# Patient Record
Sex: Female | Born: 1975 | Race: White | Hispanic: No | Marital: Married | Smoking: Current every day smoker
Health system: Southern US, Community
[De-identification: ages and names within clinical notes are randomized; demographics above are authoritative.]

## PROBLEM LIST (undated history)

## (undated) DIAGNOSIS — Z8541 Personal history of malignant neoplasm of cervix uteri: Secondary | ICD-10-CM

## (undated) DIAGNOSIS — N809 Endometriosis, unspecified: Secondary | ICD-10-CM

## (undated) DIAGNOSIS — G43719 Chronic migraine without aura, intractable, without status migrainosus: Secondary | ICD-10-CM

## (undated) DIAGNOSIS — G5600 Carpal tunnel syndrome, unspecified upper limb: Secondary | ICD-10-CM

## (undated) HISTORY — DX: Personal history of malignant neoplasm of cervix uteri: Z85.41

## (undated) HISTORY — DX: Chronic migraine without aura, intractable, without status migrainosus: G43.719

## (undated) HISTORY — DX: Carpal tunnel syndrome, unspecified upper limb: G56.00

## (undated) HISTORY — DX: Endometriosis, unspecified: N80.9

---

## 2005-02-09 ENCOUNTER — Ambulatory Visit: Payer: Self-pay | Admitting: Family Medicine

## 2005-02-25 ENCOUNTER — Ambulatory Visit: Payer: Self-pay | Admitting: Family Medicine

## 2005-04-02 HISTORY — PX: LEEP: SHX91

## 2005-04-15 ENCOUNTER — Ambulatory Visit: Payer: Self-pay | Admitting: Unknown Physician Specialty

## 2005-05-02 HISTORY — PX: CERVICAL BIOPSY: SHX590

## 2005-05-27 ENCOUNTER — Ambulatory Visit: Payer: Self-pay | Admitting: Unknown Physician Specialty

## 2005-12-13 ENCOUNTER — Ambulatory Visit: Payer: Self-pay | Admitting: Family Medicine

## 2007-08-09 ENCOUNTER — Ambulatory Visit: Payer: Self-pay | Admitting: Family Medicine

## 2007-08-09 DIAGNOSIS — B9789 Other viral agents as the cause of diseases classified elsewhere: Secondary | ICD-10-CM | POA: Insufficient documentation

## 2007-08-10 ENCOUNTER — Telehealth (INDEPENDENT_AMBULATORY_CARE_PROVIDER_SITE_OTHER): Payer: Self-pay | Admitting: Internal Medicine

## 2007-08-10 LAB — CONVERTED CEMR LAB
Basophils Absolute: 0 10*3/uL (ref 0.0–0.1)
Eosinophils Absolute: 0.4 10*3/uL (ref 0.0–0.6)
Eosinophils Relative: 4.3 % (ref 0.0–5.0)
HCT: 45.3 % (ref 36.0–46.0)
Hemoglobin: 15.2 g/dL — ABNORMAL HIGH (ref 12.0–15.0)
Lymphocytes Relative: 36.5 % (ref 12.0–46.0)
MCHC: 33.7 g/dL (ref 30.0–36.0)
MCV: 93.4 fL (ref 78.0–100.0)
Monocytes Absolute: 0.6 10*3/uL (ref 0.2–0.7)
Neutro Abs: 5.3 10*3/uL (ref 1.4–7.7)
Neutrophils Relative %: 52.4 % (ref 43.0–77.0)
WBC: 9.9 10*3/uL (ref 4.5–10.5)

## 2007-10-26 ENCOUNTER — Ambulatory Visit: Payer: Self-pay | Admitting: Family Medicine

## 2007-10-26 DIAGNOSIS — G43719 Chronic migraine without aura, intractable, without status migrainosus: Secondary | ICD-10-CM | POA: Insufficient documentation

## 2007-10-27 ENCOUNTER — Encounter: Payer: Self-pay | Admitting: Internal Medicine

## 2007-10-27 DIAGNOSIS — Z8541 Personal history of malignant neoplasm of cervix uteri: Secondary | ICD-10-CM

## 2007-11-01 ENCOUNTER — Ambulatory Visit: Payer: Self-pay | Admitting: Internal Medicine

## 2007-11-02 ENCOUNTER — Encounter (INDEPENDENT_AMBULATORY_CARE_PROVIDER_SITE_OTHER): Payer: Self-pay | Admitting: *Deleted

## 2008-04-01 ENCOUNTER — Ambulatory Visit: Payer: Self-pay | Admitting: Family Medicine

## 2008-04-01 DIAGNOSIS — G5600 Carpal tunnel syndrome, unspecified upper limb: Secondary | ICD-10-CM

## 2008-08-31 ENCOUNTER — Emergency Department: Payer: Self-pay | Admitting: Emergency Medicine

## 2008-10-02 ENCOUNTER — Ambulatory Visit: Payer: Self-pay | Admitting: Unknown Physician Specialty

## 2008-10-08 ENCOUNTER — Inpatient Hospital Stay: Payer: Self-pay | Admitting: Unknown Physician Specialty

## 2008-10-08 ENCOUNTER — Encounter (INDEPENDENT_AMBULATORY_CARE_PROVIDER_SITE_OTHER): Payer: Self-pay | Admitting: Internal Medicine

## 2008-10-08 HISTORY — PX: WEDGE RESECTION: SHX5070

## 2008-10-18 ENCOUNTER — Encounter (INDEPENDENT_AMBULATORY_CARE_PROVIDER_SITE_OTHER): Payer: Self-pay | Admitting: Internal Medicine

## 2009-05-03 IMAGING — US US PELV - US TRANSVAGINAL
1 series · 16 of 25 positions shown · non-contrast
Comparison: none

REASON FOR EXAM: SUDDEN-ONSELT RLQ PAIN - EVAL OVARIAN CYST, EVAL
APPENDIX IF VISUALIZED
COMMENTS:

[Series 1: us pelv - us transvaginal · 16 of 75 slices shown]
[im 1/75]
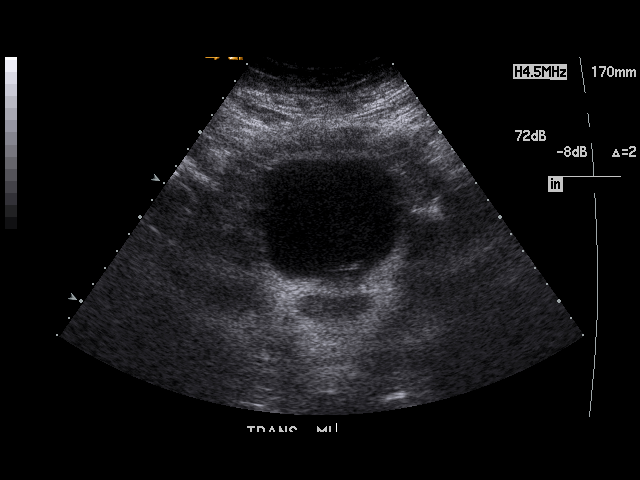
[im 7/75]
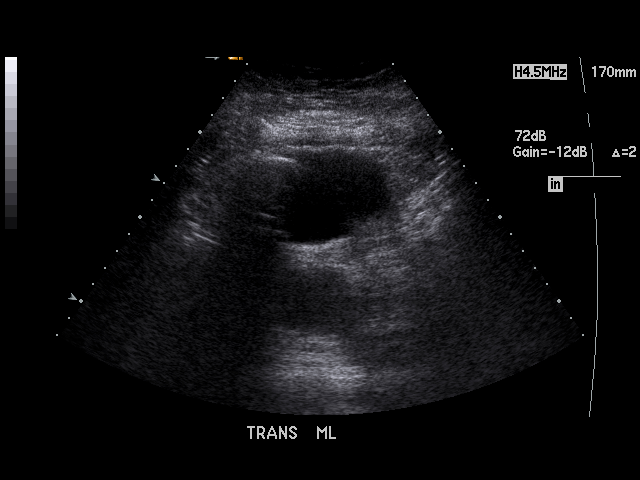
[im 10/75]
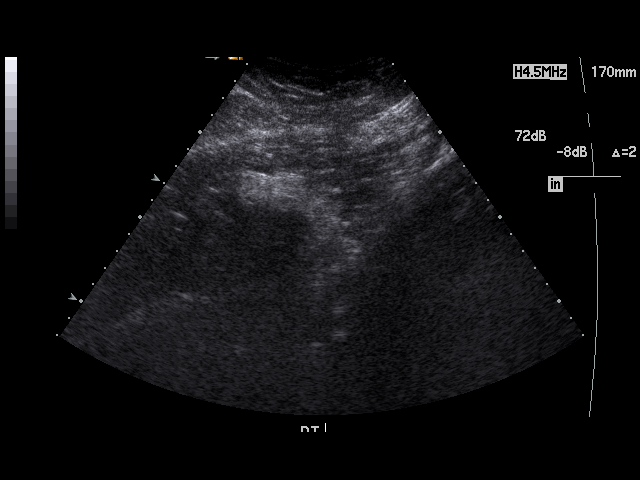
[im 16/75]
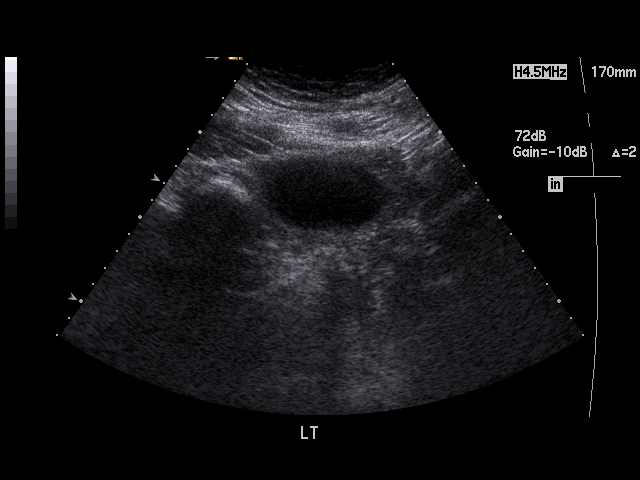
[im 22/75]
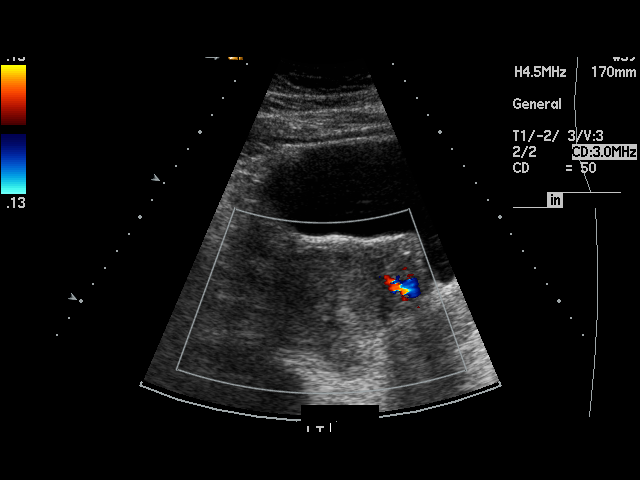
[im 25/75]
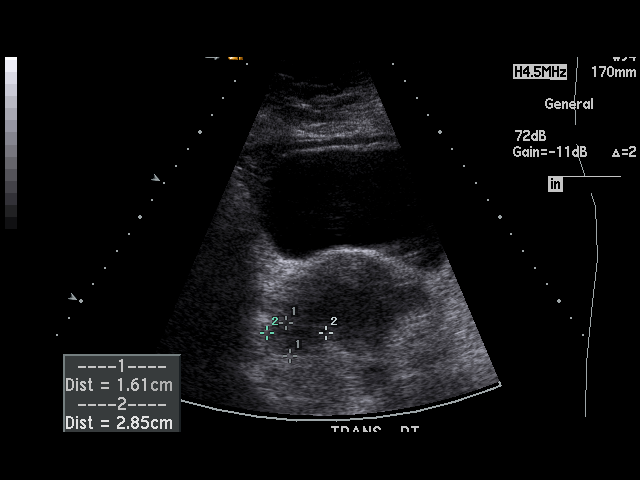
[im 31/75]
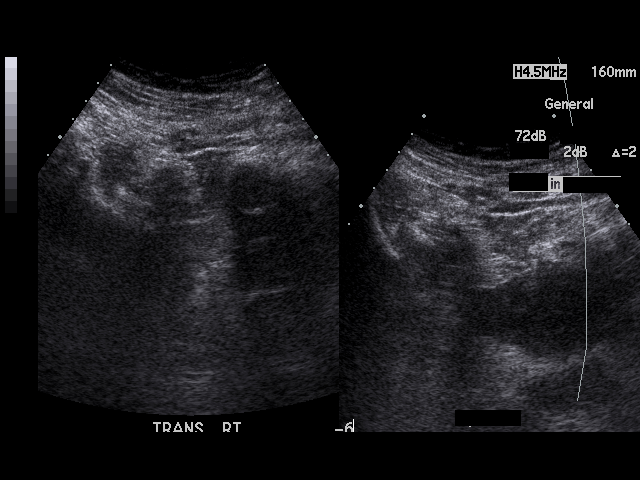
[im 34/75]
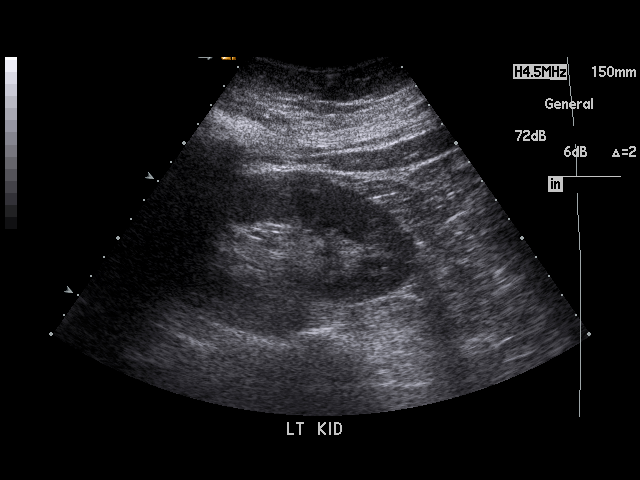
[im 41/75]
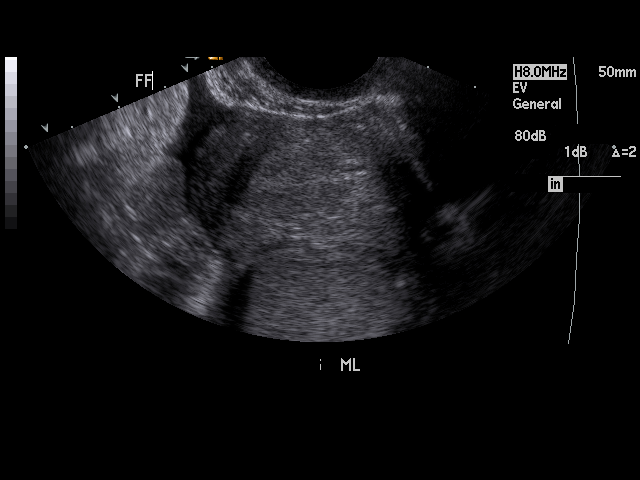
[im 44/75]
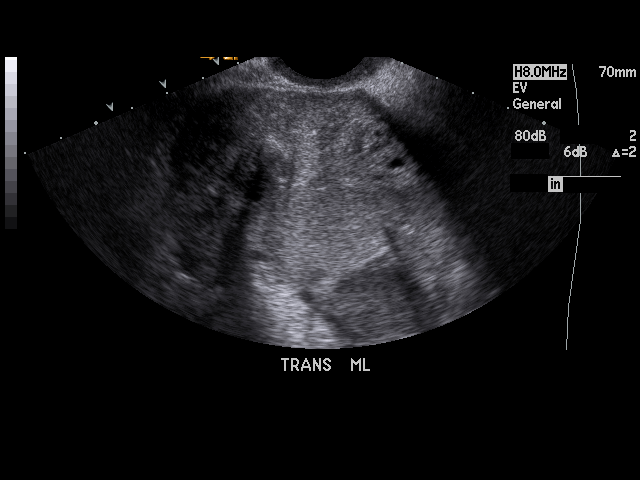
[im 50/75]
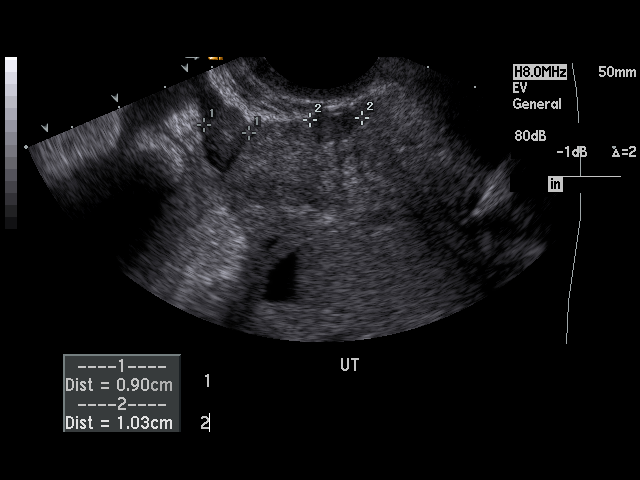
[im 53/75]
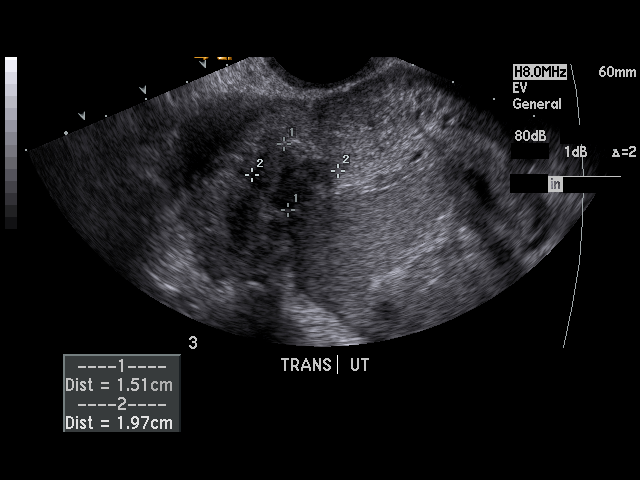
[im 59/75]
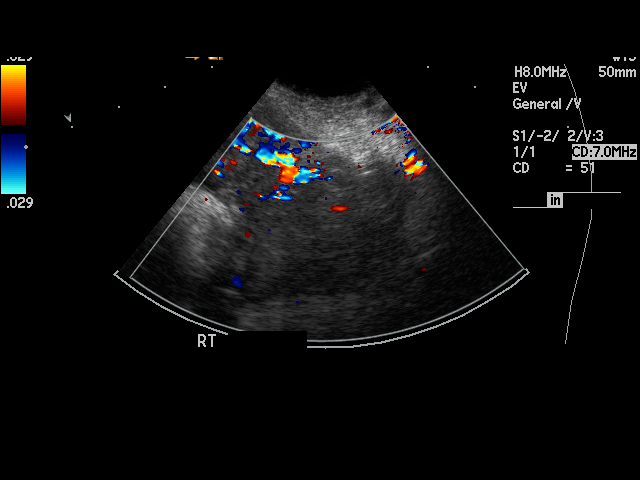
[im 65/75]
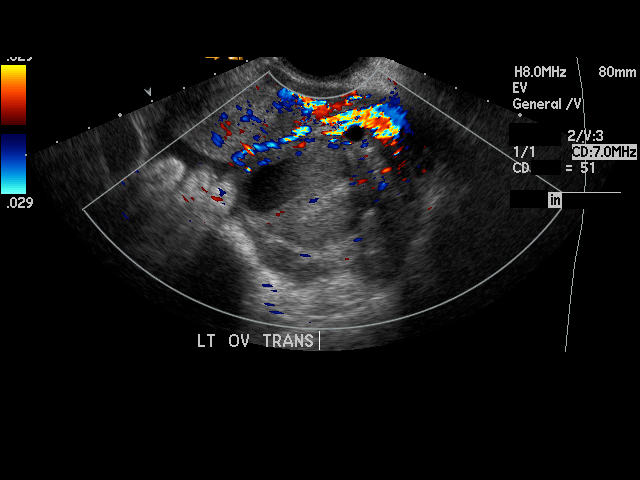
[im 68/75]
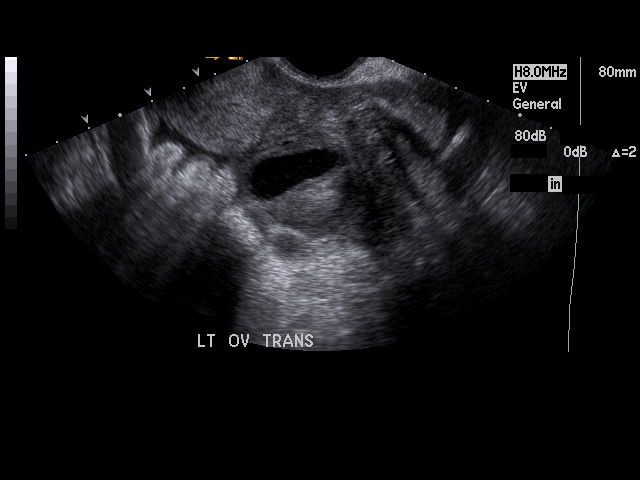
[im 75/75]
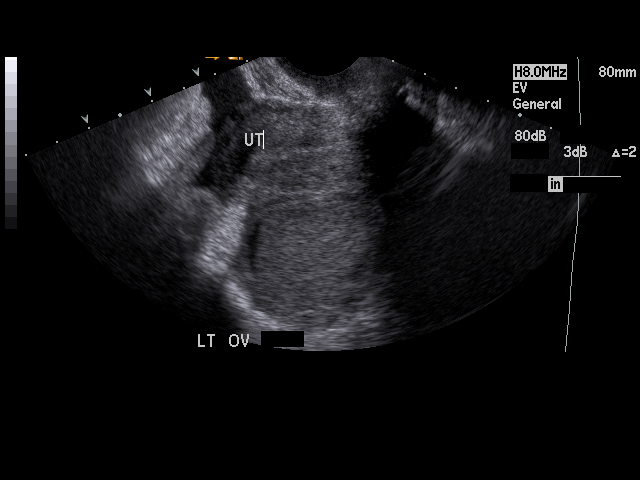

[16 of 25 positions shown; findings below may reference images not displayed]

PROCEDURE:     US  - US PELVIS MASS EXAM W/TRANSVAGI  - August 31, 2008  [DATE]

RESULT:     Transabdominal and endovaginal imaging was obtained as well as
arterial and venous Doppler evaluation of the ovaries.

The uterus measures 6.99 x 3.5 x 5.07 cm. Three, small heterogeneous nodules
are identified within the uterus indicative of multiple leiomyomata. The
endometrium measures 4.1 mm in diameter. Evaluation of the left ovary
demonstrates dimensions of 6.53 x 5.41 x 6.17 cm. A large heterogeneous mass
is appreciated involving the left ovary measuring 6.71 x 4.84 x 5.3 cm. This
area is concerning and differential considerations are a complex and/or
hemorrhagic cyst or possible endometrioma. More ominous etiologies cannot be
excluded and further evaluation with GYN consultation is recommended and, if
clinically warranted, direct visualization. The right ovary measures 3.64 x
2.83 x 3.01 cm and demonstrates a homogeneous echotexture.

Dimensions of the leiomyomata appreciated within the uterus are as follows:
A subserosal nodule is appreciated along the apex of the fundus of the
uterus measuring 9 mm in diameter. A second area is identified anteriorly
measuring 1.03 cm. A third, intramural area is appreciated measuring 1.2 x
1.3 x 1.05 cm.
IMPRESSION: 1.     Large heterogeneous mass involving the left ovary. Differential
considerations are a hemorrhagic versus proteinaceous cyst and endometrioma.
More ominous etiologies cannot be excluded and GYN consultation is
recommended, if and as clinically warranted, as well as direct visualization.
2.     Multiple, small leiomyomata involving the uterus.
3.     Dr. Elayne of the Emergency Department was informed of these findings
via preliminary faxed report on 08/31/2008 at [DATE], Central Standard Time.

## 2009-07-23 ENCOUNTER — Ambulatory Visit: Payer: Self-pay | Admitting: Family Medicine

## 2010-07-30 ENCOUNTER — Ambulatory Visit
Admission: RE | Admit: 2010-07-30 | Discharge: 2010-07-30 | Payer: Self-pay | Source: Home / Self Care | Attending: Family Medicine | Admitting: Family Medicine

## 2010-07-30 DIAGNOSIS — J157 Pneumonia due to Mycoplasma pneumoniae: Secondary | ICD-10-CM

## 2010-09-03 NOTE — Assessment & Plan Note (Signed)
Summary: COLD SYMPTOMS WILL NOT GO AWAY/JRR   Vital Signs:  Patient profile:   35 year old female Height:      64 inches Weight:      240 pounds BMI:     41.34 Temp:     98.0 degrees F oral Pulse rate:   88 / minute Pulse rhythm:   regular BP sitting:   110 / 80  (left arm) Cuff size:   large  Vitals Entered By: Benny Lennert CMA Duncan Dull) (July 30, 2010 11:35 AM)  History of Present Illness: Chief complaint cold symptoms that will not go away  35 year old female:  Congestion in chest face h  R max hurt the other day  when laying down at night, deep cough  Tried mucinex, delsym.   Acute Visit History:      The patient complains of cough, earache, fever, nasal discharge, and sinus problems.  These symptoms began 2 weeks ago.  The patient notes wheezing.  The character of the cough is described as productive.  There is no history of sleep interference, shortness of breath, respiratory retractions, tachypnea, cyanosis, or interference with oral intake associated with her cough.        She complains of sinus pressure.        Urine output has been normal.        Allergies: 1)  ! * Maxalt 2)  ! * Imitrex  Past History:  Past medical, surgical, family and social histories (including risk factors) reviewed, and no changes noted (except as noted below).  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 COLD KNIFE BIOPSY OF CERVIX :(05/2005) LEEP : POSITIVE :(04/2005) wedge resection of L ovarian endometrioma---10/08/2008--[facility]  Family History: Reviewed history and no changes required.  Social History: Reviewed history from 07/23/2009 and no changes required. Marital Status: Married Children: 0 Occupation: Neurosurgeon  Review of Systems   REVIEW OF SYSTEMS GEN: Acute illness details above. CV: No chest pain or SOB GI: No noted N or V Otherwise, pertinent positives and negatives are noted in the HPI.   Physical Exam  Additional Exam:  GEN: A and O x 3. WDWN. NAD.    ENT: Nose clear, ext NML.  No LAD.  No JVD.  TM's clear. Oropharynx clear.  PULM: Normal WOB, no distress. No crackles, diffuse rhonchorous sounds with wheezing rarely CV: RRR, no M/G/R, No rubs, No JVD.   ABD: S, NT, ND, + BS. No rebound. No guarding. No HSM.   EXT: warm and well-perfused, No c/c/e. PSYCH: Pleasant and conversant.    Impression & Recommendations:  Problem # 1:  WALKING PNEUMONIA (ICD-483.0) Assessment New  The following medications were removed from the medication list:    Augmentin 875-125 Mg Tabs (Amoxicillin-pot clavulanate) .Marland Kitchen... Take 1 two times a day Her updated medication list for this problem includes:    Doxycycline Hyclate 100 Mg Caps (Doxycycline hyclate) .Marland Kitchen... Take 1 tab twice a day  Instructed patient to complete antibiotics, and call for worsened shortness of breath or new symptoms.   Complete Medication List: 1)  Flexeril 10 Mg Tabs (Cyclobenzaprine hcl) .... Take 1 tablet by mouth three times a day as needed 2)  Cymbalta 60 Mg Cpep (Duloxetine hcl) .... One tablet daily 3)  Cambia 50 Mg Pack (Diclofenac potassium) .... As needed  for headaches 4)  Doxycycline Hyclate 100 Mg Caps (Doxycycline hyclate) .... Take 1 tab twice a day 5)  Hydrocodone-homatropine 5-1.5 Mg/61ml Syrp (Hydrocodone-homatropine) .Marland Kitchen.. 1 - 2 tsp by mouth at bedtime  Patient Instructions: 1)  PNEUMONIA 2)  -Viral or baterial infections of the lung. Fever, cough, chest pain, shortness of breath, phlegm production, fatigue are symptoms. 3)  Treatment: 4)  1. Take all medicines 5)  2. Antibiotics  6)  3. Cough suppressants 7)  4. Bronchodilators: an inhaler 8)  5. Expectorant like Guaifenesin (Robitussin, Mucinex) 9)  Fluids and Moisture help: drink  lots of fluids 10)  Vaporizier or humidifier in room, shower steam 11)  --help loosen secretions and sooth breathing passages 12)  Elevate head slightly when trying to sleep.  Prescriptions: HYDROCODONE-HOMATROPINE 5-1.5 MG/5ML SYRP (HYDROCODONE-HOMATROPINE) 1 - 2 tsp by mouth at bedtime  #240 mL x 0   Entered and Authorized by:   Hannah Beat MD   Signed by:   Hannah Beat MD on 07/30/2010   Method used:   Print then Give to Patient   RxID:   [id number] DOXYCYCLINE HYCLATE 100 MG CAPS (DOXYCYCLINE HYCLATE) Take 1 tab twice a day  #20 x 0   Entered and Authorized by:   Hannah Beat MD   Signed by:   Hannah Beat MD on 07/30/2010   Method used:   Print then Give to Patient   RxID:   [id number]    Orders Added: 1)  Est. Patient Level IV [30160]    Current Allergies (reviewed today): ! * MAXALT ! * IMITREX

## 2011-03-22 ENCOUNTER — Ambulatory Visit (INDEPENDENT_AMBULATORY_CARE_PROVIDER_SITE_OTHER): Payer: 59 | Admitting: Family Medicine

## 2011-03-22 ENCOUNTER — Encounter: Payer: Self-pay | Admitting: Internal Medicine

## 2011-03-22 ENCOUNTER — Encounter: Payer: Self-pay | Admitting: Family Medicine

## 2011-03-22 VITALS — BP 110/78 | HR 84 | Temp 98.4°F | Ht 66.0 in | Wt 245.0 lb

## 2011-03-22 DIAGNOSIS — S39012A Strain of muscle, fascia and tendon of lower back, initial encounter: Secondary | ICD-10-CM

## 2011-03-22 DIAGNOSIS — S335XXA Sprain of ligaments of lumbar spine, initial encounter: Secondary | ICD-10-CM

## 2011-03-22 MED ORDER — CYCLOBENZAPRINE HCL 10 MG PO TABS: 10.0000 mg | ORAL_TABLET | Freq: Two times a day (BID) | ORAL | Status: AC | PRN

## 2011-03-22 MED ORDER — NAPROXEN 500 MG PO TABS: ORAL_TABLET | ORAL | Status: AC

## 2011-03-22 NOTE — Assessment & Plan Note (Signed)
Flexeril/NSAID scheduled for next week. States can tolerate NSAIDs fine. Supportive care as per instructions with rec to strengthen core muscles to prevent further injury. Update [place] if not improving as expected.

## 2011-03-22 NOTE — Progress Notes (Signed)
  Subjective:    Patient ID: Krystal Campos, female    DOB: 01-05-1976, 35 y.o.   MRN: [id number]  HPI CC: pulled back  Yesterday cleaning and pulled muscle when flipped rubbermaid.  Lower back pain, more on right side.  Has tried ibuprofen 600mg  prn, flexeril which helped some.  Standing/walking worsens pain.  Sitting/laying improves pain.  Unable to bend over.  Also tried ice/heat wrap.  Ice seems to make pain worse.  No fevers/chills, no radiculopathy, no numbness, no bowel/bladder accidents.  Last time this happened was 1 year ago.  Review of Systems Per HPI    Objective:   Physical Exam  Nursing note and vitals reviewed. Constitutional: She appears well-developed and well-nourished. No distress.  HENT:  Head: Normocephalic and atraumatic.  Musculoskeletal:       Midline lumbar tenderness as well as lumbar paraspinous mm tightness. Neg SLR. + pain SIJ right as well as mildly pos FABER on right. No pain with int/ext rotation at hip.  Neurological: She is alert.       Diminished DTRs bilaterally. Sensation intact          Assessment & Plan:

## 2011-03-22 NOTE — Patient Instructions (Addendum)
You have pulled back - lumbar strain. Treat with flexeril and naprosyn regularly for 1 wk then just as needed. Flexeril can make you sleepy. Call us with questions or if not improving as expected. Stretching exercises provided and look into core strengthening exercises.

## 2011-08-11 ENCOUNTER — Encounter: Payer: Self-pay | Admitting: Family Medicine

## 2011-08-11 ENCOUNTER — Ambulatory Visit (INDEPENDENT_AMBULATORY_CARE_PROVIDER_SITE_OTHER): Payer: 59 | Admitting: Family Medicine

## 2011-08-11 VITALS — BP 120/84 | HR 76 | Temp 98.2°F | Resp 20 | Ht 66.0 in | Wt 235.2 lb

## 2011-08-11 DIAGNOSIS — J40 Bronchitis, not specified as acute or chronic: Secondary | ICD-10-CM | POA: Insufficient documentation

## 2011-08-11 MED ORDER — GUAIFENESIN-CODEINE 100-10 MG/5ML PO SYRP: 5.0000 mL | ORAL_SOLUTION | Freq: Three times a day (TID) | ORAL | Status: AC | PRN

## 2011-08-11 MED ORDER — AZITHROMYCIN 250 MG PO TABS: ORAL_TABLET | ORAL | Status: AC

## 2011-08-11 NOTE — Progress Notes (Signed)
Subjective:    Patient ID: Krystal Campos, female    DOB: 1976-06-17, 36 y.o.   MRN: [id number]  HPI Symptoms of uri started on Sunday  Started coughing - husband had been sick  Cough is getting worse and worse - feels tight in chest and honky / barky cough  Not coughing a lot up   Non smoker - used to smoke - quit over a year ago  husb quit too  Nephew who lives with them smokes outside   [place] and stuffy nose and st  Ears are itchy and full   No v/d -- a little nausea   otc meds:  nyquil and dayquil   Patient Active Problem List  Diagnoses  . 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 CARPAL TUNNEL SYNDROME, LEFT  . CARCINOMA IN SITU, CERVIX, HX OF  . Lumbar strain  . Bronchitis   Past Medical History  Diagnosis Date  . Carpal tunnel syndrome   . Personal history of malignant neoplasm of cervix uteri   . Chronic migraine without aura, with intractable migraine, so stated, without mention of status migrainosus    Past Surgical History  Procedure Date  . Cervical biopsy 10/06    cold kinfe bx  . Leep 9/06    positive  . Wedge resection 10/08/08    Left ovarian endometrioma--[facility]   History  Substance Use Topics  . Smoking status: Former Smoker    Quit date: 09/30/2009  . Smokeless tobacco: Not on file  . Alcohol Use: Yes     Rarely   No family history on file. Allergies  Allergen Reactions  . Rizatriptan Benzoate     REACTION: CHEST TIGHTNESS AND NAUSEA  . Sumatriptan     REACTION: CHEST TIGHTNESS AND NAUSEA   Current Outpatient Prescriptions on File Prior to Visit  Medication Sig Dispense Refill  . Diclofenac Potassium (CAMBIA) 50 MG PACK Take by mouth as needed.        . DULoxetine (CYMBALTA) 60 MG capsule Take 60 mg by mouth daily.        . cyclobenzaprine (FLEXERIL) 10 MG tablet Take 1 tablet (10 mg total) by mouth 2 (two) times daily as needed for muscle spasms.  45 tablet  0  . naproxen (NAPROSYN) 500 MG tablet Take one po bid x 1 week  then prn pain, take with food  60 tablet  0      Review of Systems Review of Systems  Constitutional: Negative for and unexpected weight change. pos for low grade fever and malaise Eyes: Negative for pain and visual disturbance.  ENT pos for cong/ st, neg for sinus pain  Respiratory: Negative for sob / wheeze  Cardiovascular: Negative for cp or palpitations    Gastrointestinal: Negative for nausea, diarrhea and constipation.  Genitourinary: Negative for urgency and frequency.  Skin: Negative for pallor or rash   Neurological: Negative for weakness, light-headedness, numbness and headaches.  Hematological: Negative for adenopathy. Does not bruise/bleed easily.  Psychiatric/Behavioral: Negative for dysphoric mood. The patient is not nervous/anxious.         Objective:   Physical Exam  Constitutional: She appears well-developed and well-nourished. No distress.       overwt and well appearing   HENT:  Head: Normocephalic and atraumatic.  Right Ear: External ear normal.  Left Ear: External ear normal.  Mouth/Throat: Oropharynx is clear and moist.       Nares are injected and congested  No sinus tenderness  Eyes: Conjunctivae and EOM are normal. Pupils are equal, round, and reactive to light. Right eye exhibits no discharge. Left eye exhibits no discharge.  Neck: Normal range of motion. Neck supple. No JVD present. Erythema present. No thyromegaly present.  Cardiovascular: Normal rate, regular rhythm and normal heart sounds.   Pulmonary/Chest: Effort normal. No respiratory distress. She has wheezes. She has no rales. She exhibits no tenderness.       Harsh bs diffusely- also distant Fair air exch Wheeze on forced exp only Some rhonchi scattered - mild   Lymphadenopathy:    She has no cervical adenopathy.  Neurological: She is alert.  Skin: Skin is warm and dry. No rash noted. No pallor.  Psychiatric: She has a normal mood and affect.          Assessment & Plan:

## 2011-08-11 NOTE — Assessment & Plan Note (Addendum)
Acute - following uri in former smoker with mild reactive airways (still exp to 2nd hand smoke)  Also hx of mycoplasma in past  She does need a flu shot and pneumovax when better Will cover with zpak  Disc symptomatic care - see instructions on AVS  Robitussin with codeine when home  Update if not starting to improve in a week or if worsening

## 2011-08-11 NOTE — Patient Instructions (Addendum)
Drink lots of fluids - rest as much as you can  Get a flu shot when you are feeling better  Kick the smoker out of the house to smoke  Take zpak as directed  Use cough syrup when not working or driving  Update if not starting to improve in a week or if worsening

## 2012-05-11 ENCOUNTER — Ambulatory Visit: Payer: Self-pay | Admitting: Obstetrics and Gynecology

## 2012-05-30 ENCOUNTER — Ambulatory Visit: Payer: Self-pay | Admitting: Obstetrics and Gynecology

## 2012-05-30 LAB — URINALYSIS, COMPLETE
Bacteria: NONE SEEN
Bilirubin,UR: NEGATIVE
Ph: 6 (ref 4.5–8.0)
Protein: NEGATIVE
RBC,UR: 2 /HPF (ref 0–5)
Squamous Epithelial: 1

## 2012-05-30 LAB — CBC
HCT: 40.4 % (ref 35.0–47.0)
HGB: 13.5 g/dL (ref 12.0–16.0)
MCV: 88 fL (ref 80–100)
RBC: 4.57 10*6/uL (ref 3.80–5.20)
RDW: 14.5 % (ref 11.5–14.5)
WBC: 11.1 10*3/uL — ABNORMAL HIGH (ref 3.6–11.0)

## 2012-05-30 LAB — PREGNANCY, URINE: Pregnancy Test, Urine: NEGATIVE m[IU]/mL

## 2012-06-02 HISTORY — PX: TOTAL ABDOMINAL HYSTERECTOMY W/ BILATERAL SALPINGOOPHORECTOMY: SHX83

## 2012-06-06 ENCOUNTER — Inpatient Hospital Stay: Payer: Self-pay | Admitting: Obstetrics and Gynecology

## 2012-06-06 LAB — BASIC METABOLIC PANEL
Anion Gap: 6 — ABNORMAL LOW (ref 7–16)
BUN: 11 mg/dL (ref 7–18)
Creatinine: 1.33 mg/dL — ABNORMAL HIGH (ref 0.60–1.30)
EGFR (African American): 59 — ABNORMAL LOW
Glucose: 157 mg/dL — ABNORMAL HIGH (ref 65–99)
Osmolality: 275 (ref 275–301)

## 2012-06-06 LAB — HEMATOCRIT: HCT: 28.5 % — ABNORMAL LOW (ref 35.0–47.0)

## 2012-06-07 ENCOUNTER — Encounter: Payer: Self-pay | Admitting: Family Medicine

## 2012-06-07 LAB — CBC WITH DIFFERENTIAL/PLATELET
Basophil #: 0 10*3/uL (ref 0.0–0.1)
Basophil %: 0.1 %
Eosinophil #: 0 10*3/uL (ref 0.0–0.7)
HCT: 32 % — ABNORMAL LOW (ref 35.0–47.0)
Lymphocyte #: 2.2 10*3/uL (ref 1.0–3.6)
Lymphocyte %: 11.1 %
MCH: 28.5 pg (ref 26.0–34.0)
MCV: 87 fL (ref 80–100)
Neutrophil #: 15.2 10*3/uL — ABNORMAL HIGH (ref 1.4–6.5)
Platelet: 255 10*3/uL (ref 150–440)
RBC: 3.7 10*6/uL — ABNORMAL LOW (ref 3.80–5.20)
RDW: 15.1 % — ABNORMAL HIGH (ref 11.5–14.5)

## 2012-06-07 LAB — BASIC METABOLIC PANEL
Anion Gap: 8 (ref 7–16)
Calcium, Total: 7.1 mg/dL — ABNORMAL LOW (ref 8.5–10.1)
Co2: 22 mmol/L (ref 21–32)
Creatinine: 1.11 mg/dL (ref 0.60–1.30)
Potassium: 4.9 mmol/L (ref 3.5–5.1)
Sodium: 140 mmol/L (ref 136–145)

## 2012-06-08 LAB — BASIC METABOLIC PANEL
Anion Gap: 6 — ABNORMAL LOW (ref 7–16)
BUN: 9 mg/dL (ref 7–18)
Calcium, Total: 7.5 mg/dL — ABNORMAL LOW (ref 8.5–10.1)
Chloride: 115 mmol/L — ABNORMAL HIGH (ref 98–107)
Co2: 24 mmol/L (ref 21–32)
EGFR (African American): >60
Osmolality: 289 (ref 275–301)

## 2012-06-08 LAB — HEMOGLOBIN: HGB: 7.9 g/dL — ABNORMAL LOW (ref 12.0–16.0)

## 2012-06-09 LAB — CBC WITH DIFFERENTIAL/PLATELET
Basophil %: 0.1 %
Eosinophil #: 0.3 10*3/uL (ref 0.0–0.7)
HCT: 27.7 % — ABNORMAL LOW (ref 35.0–47.0)
HGB: 9.2 g/dL — ABNORMAL LOW (ref 12.0–16.0)
Lymphocyte %: 11.2 %
MCHC: 33.2 g/dL (ref 32.0–36.0)
Monocyte #: 1.6 x10 3/mm — ABNORMAL HIGH (ref 0.2–0.9)
Monocyte %: 10.3 %
Neutrophil %: 76.4 %
RBC: 3.2 10*6/uL — ABNORMAL LOW (ref 3.80–5.20)
RDW: 15.3 % — ABNORMAL HIGH (ref 11.5–14.5)
WBC: 15.9 10*3/uL — ABNORMAL HIGH (ref 3.6–11.0)

## 2012-09-21 ENCOUNTER — Encounter: Payer: Self-pay | Admitting: Family Medicine

## 2012-09-21 ENCOUNTER — Ambulatory Visit (INDEPENDENT_AMBULATORY_CARE_PROVIDER_SITE_OTHER): Payer: 59 | Admitting: Family Medicine

## 2012-09-21 VITALS — BP 122/78 | HR 84 | Temp 98.3°F | Wt 227.8 lb

## 2012-09-21 DIAGNOSIS — L720 Epidermal cyst: Secondary | ICD-10-CM

## 2012-09-21 DIAGNOSIS — L723 Sebaceous cyst: Secondary | ICD-10-CM

## 2012-09-21 MED ORDER — CEPHALEXIN 500 MG PO CAPS: 500.0000 mg | ORAL_CAPSULE | Freq: Three times a day (TID) | ORAL | Status: DC

## 2012-09-21 NOTE — Assessment & Plan Note (Addendum)
Anticipate epidermal cyst of neck - treat with antibiotic course given evidence of current infection and refer to derm given location for further eval/tx. Pt agrees with plan.

## 2012-09-21 NOTE — Patient Instructions (Signed)
I think you have epidermal cyst - treat with antibiotic course and referral to dermatology for further evaluation given location May use anti inflammatories for pain as needed.  Epidermal Cyst An epidermal cyst is sometimes called a sebaceous cyst, epidermal inclusion cyst, or infundibular cyst. These cysts usually contain a substance that looks "pasty" or "cheesy" and may have a bad smell. This substance is a protein called keratin. Epidermal cysts are usually found on the face, neck, or trunk. They may also occur in the vaginal area or other parts of the genitalia of both men and women. Epidermal cysts are usually small, painless, slow-growing bumps or lumps that move freely under the skin. It is important not to try to pop them. This may cause an infection and lead to tenderness and swelling. CAUSES  Epidermal cysts may be caused by a deep penetrating injury to the skin or a plugged hair follicle, often associated with acne. SYMPTOMS  Epidermal cysts can become inflamed and cause:  Redness.  Tenderness.  Increased temperature of the skin over the bumps or lumps.  Grayish-white, bad smelling material that drains from the bump or lump. DIAGNOSIS  Epidermal cysts are easily diagnosed by your caregiver during an exam. Rarely, a tissue sample (biopsy) may be taken to rule out other conditions that may resemble epidermal cysts. TREATMENT   Epidermal cysts often get better and disappear on their own. They are rarely ever cancerous.  If a cyst becomes infected, it may become inflamed and tender. This may require opening and draining the cyst. Treatment with antibiotics may be necessary. When the infection is gone, the cyst may be removed with minor surgery.  Small, inflamed cysts can often be treated with antibiotics or by injecting steroid medicines.  Sometimes, epidermal cysts become large and bothersome. If this happens, surgical removal in your caregiver's office may be necessary. HOME  CARE INSTRUCTIONS  Only take over-the-counter or prescription medicines as directed by your caregiver.  Take your antibiotics as directed. Finish them even if you start to feel better. SEEK MEDICAL CARE IF:   Your cyst becomes tender, red, or swollen.  Your condition is not improving or is getting worse.  You have any other questions or concerns. MAKE SURE YOU:  Understand these instructions.  Will watch your condition.  Will get help right away if you are not doing well or get worse. Document Released: 06/19/2004 Document Revised: 10/11/2011 Document Reviewed: 01/25/2011 [facility] Patient Information 2013 [place], [place].

## 2012-09-21 NOTE — Progress Notes (Signed)
  Subjective:    Patient ID: Krystal Campos, female    DOB: [birth year], 37 y.o.   MRN: [id number]  HPI CC: lump on neck  Intermittent h/o bump on left neck comes and goes.  Enlarges then decreases in size.  Now more tender, swollen L mandibular LN.  More swollen, red and tender.  No fevers/chills, no other skin lesions, oral lesions.  So far has tried topical blemish creams which hasn't helped.  Also using warm compresses.  On wound vac for poorly healing abdominal hysterectomy wound.  No h/o cysts on skin.    Past Medical History  Diagnosis Date  . Carpal tunnel syndrome   . Personal history of malignant neoplasm of cervix uteri   . Chronic migraine without aura, with intractable migraine, so stated, without mention of status migrainosus     Past Surgical History  Procedure Laterality Date  . Cervical biopsy  10/06    cold kinfe bx  . Leep  9/06    positive  . Wedge resection  10/08/08    Left ovarian endometrioma--[facility]  . Total abdominal hysterectomy w/ bilateral salpingoophorectomy  2013    done for dysmenorrhagia and endometriosis - ?supracervical     Review of Systems Per HPI    Objective:   Physical Exam  Nursing note and vitals reviewed. Constitutional: She appears well-developed and well-nourished. No distress.  Lymphadenopathy:    She has cervical adenopathy (L mandibular LAD).  Skin: Skin is warm and dry.  Nodule with central hole left inferior neck, tender to palpation, erythematous and swollen       Assessment & Plan:

## 2012-10-18 ENCOUNTER — Ambulatory Visit: Payer: Self-pay | Admitting: Obstetrics and Gynecology

## 2012-10-24 ENCOUNTER — Encounter: Payer: Self-pay | Admitting: Family Medicine

## 2012-10-24 ENCOUNTER — Ambulatory Visit (INDEPENDENT_AMBULATORY_CARE_PROVIDER_SITE_OTHER): Payer: 59 | Admitting: Family Medicine

## 2012-10-24 VITALS — BP 110/78 | HR 84 | Temp 98.3°F | Ht 66.0 in | Wt 230.0 lb

## 2012-10-24 DIAGNOSIS — M25562 Pain in left knee: Secondary | ICD-10-CM | POA: Insufficient documentation

## 2012-10-24 DIAGNOSIS — M25569 Pain in unspecified knee: Secondary | ICD-10-CM

## 2012-10-24 MED ORDER — NAPROXEN 500 MG PO TABS: ORAL_TABLET | ORAL | Status: DC

## 2012-10-24 NOTE — Progress Notes (Signed)
  Subjective:    Patient ID: Krystal Campos, female    DOB: Aug 27, 1975, 37 y.o.   MRN: [id number]  HPI CC: L knee pain  L knee bothering her for last few weeks, now noted increased swelling.  Trouble walking 2/2 pain.  Wakes her up at night.  Main pain is with flexion anterior knee at knee cap.  Some locking when squatting.  No giving out.  Some instability felt.  Popping.  Denies redness or warmth.  No h/o knee problems in past.  Denies inciting trauma/injury.  Week prior to pain did walk 1 mi along trail, but no injury.  So far taking ibuprofen 400mg  which helps.  Has not iced knee.  Past Medical History  Diagnosis Date  . Carpal tunnel syndrome   . Personal history of malignant neoplasm of cervix uteri   . Chronic migraine without aura, with intractable migraine, so stated, without mention of status migrainosus      Review of Systems Per HPI    Objective:   Physical Exam  Nursing note and vitals reviewed. Constitutional: She appears well-developed and well-nourished. No distress.  Musculoskeletal: She exhibits no edema.  R knee WNL  L knee - no erythema/calor or deformity. No significant tenderness to palpation at knee crepitus with flexion/extension.  FROM. Neg drawer test. Minimal tenderness with McMurray's  ++ PF grind. No abnormal patellar mobility. No ligament laxity with testing valgus/varus       Assessment & Plan:

## 2012-10-24 NOTE — Patient Instructions (Signed)
I think you have patello femoral pain. Treat with prescription anti inflammatory twice daily with food then as needed. Do stretching/strengthening exercises discussed. Focus on leg raises with foot pointed outward to strengthen quads. Let us know if any worsening or not improving as expected.

## 2012-10-24 NOTE — Assessment & Plan Note (Signed)
Anticipate patello femoral pain syndrome. Discussed this as well as treatment to include prescription NSAIDs, ice, elevation ,rest.   Provided with handout and stretching from [facility] pt advisor on runner's knee. To update if sxs persist or worsen.

## 2012-10-26 ENCOUNTER — Encounter: Payer: Self-pay | Admitting: Family Medicine

## 2013-01-11 IMAGING — US ULTRASOUND RIGHT BREAST
1 series · 14 of 20 positions shown · non-contrast
Comparison: none

REASON FOR EXAM: AV RT NODULAR DENSITY
COMMENTS:

PROCEDURE:     US  - US BREAST RIGHT  - May 11, 2012 [DATE]
RESULT:     Right breast ultrasound reveals no cystic or solid abnormality.
Given the previously identified  mammographic finding ,it is suggested that
a repeat mammogram in 3-6 months be obtained for further evaluation.

[Series 1: ultrasound right breast · 0.11mm/px · 14 of 20 slices shown]
[im 1/20]
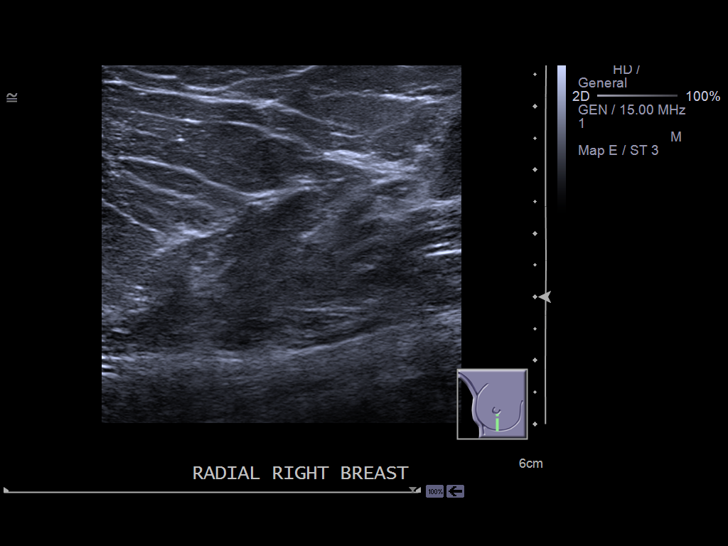
[im 3/20]
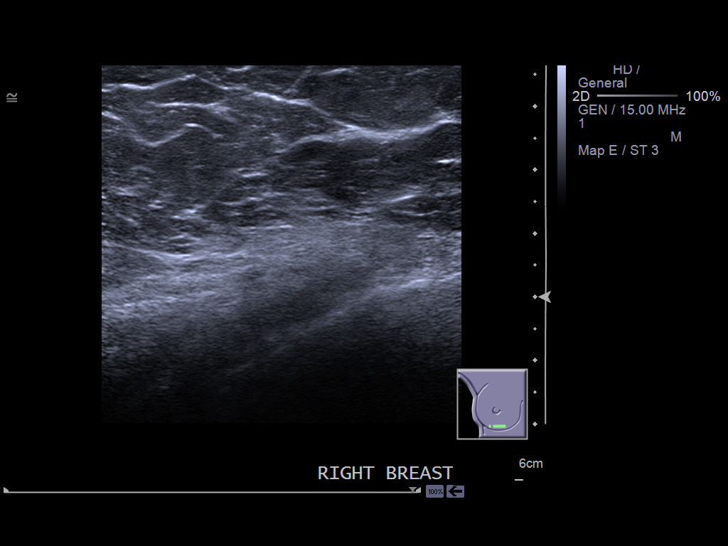
[im 4/20]
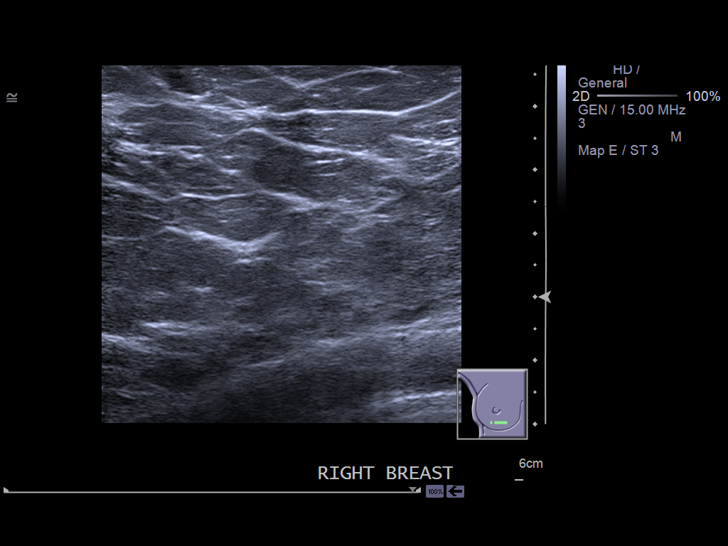
[im 6/20]
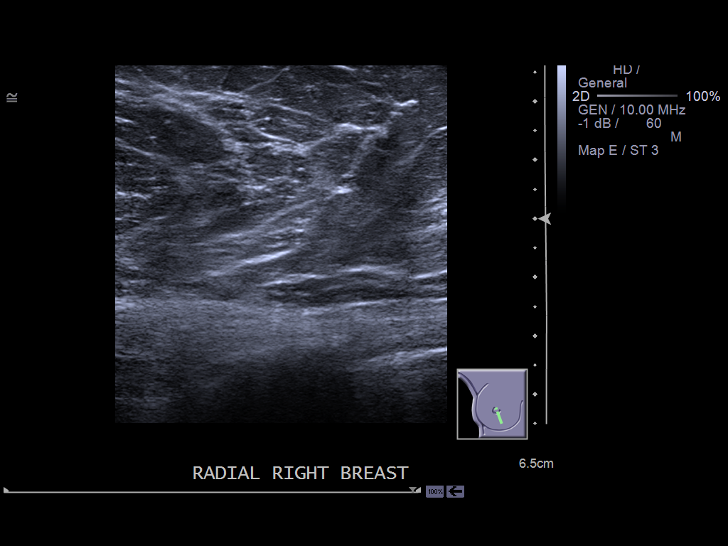
[im 7/20]
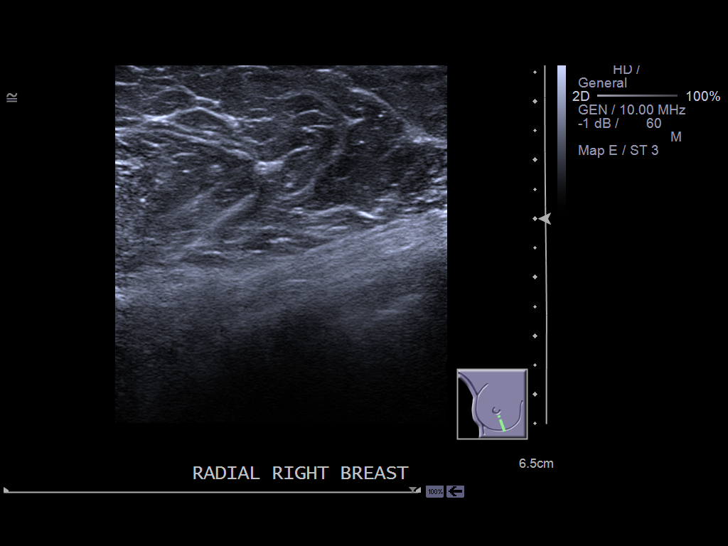
[im 8/20]
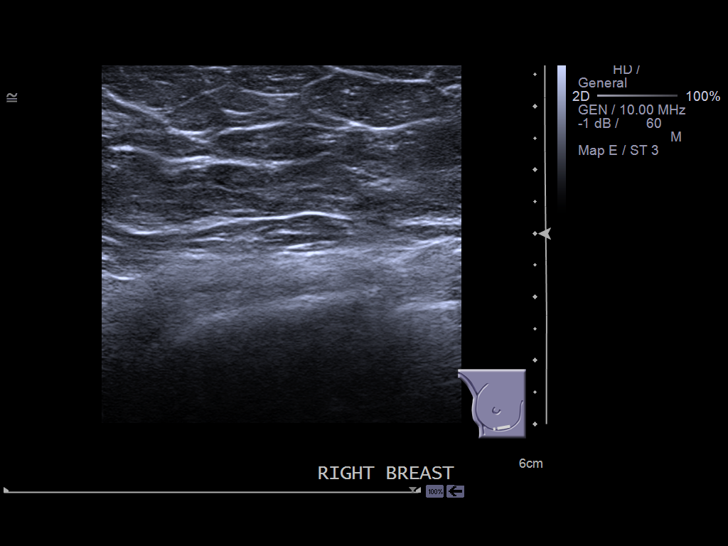
[im 10/20]
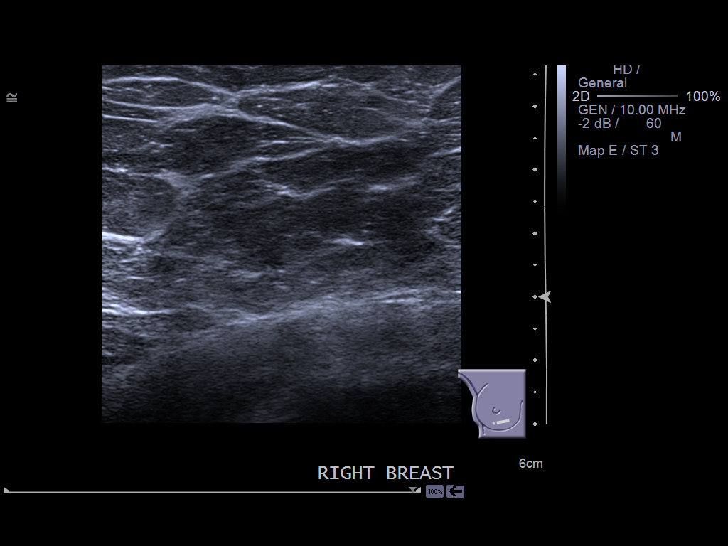
[im 11/20]
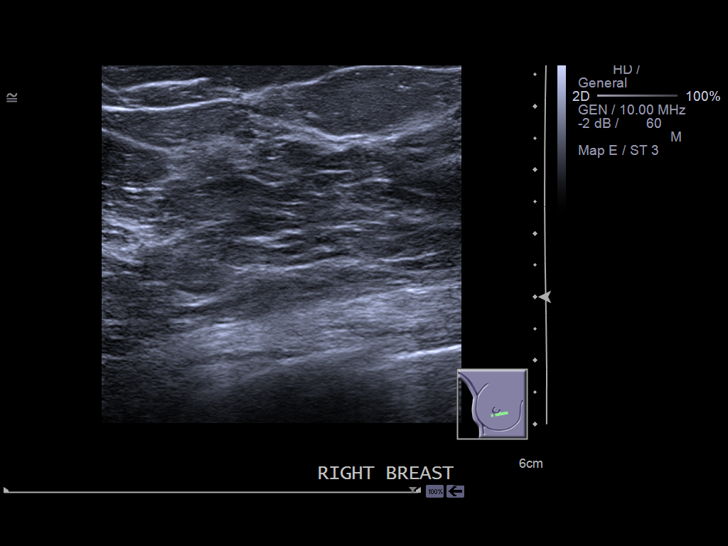
[im 13/20]
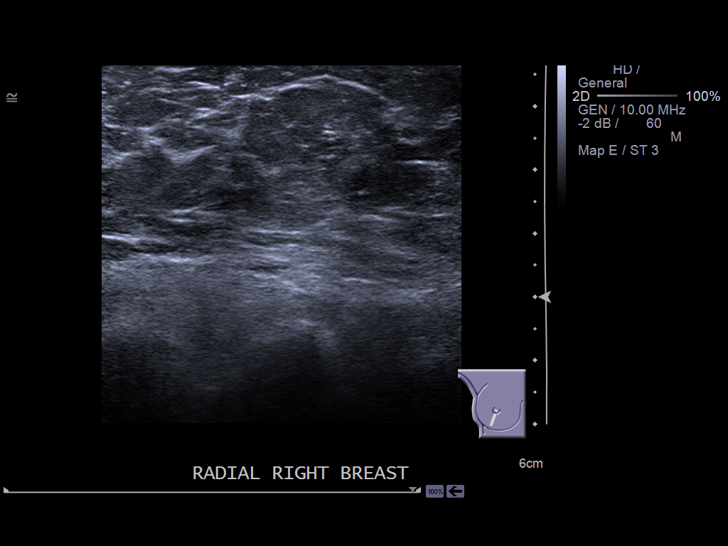
[im 14/20]
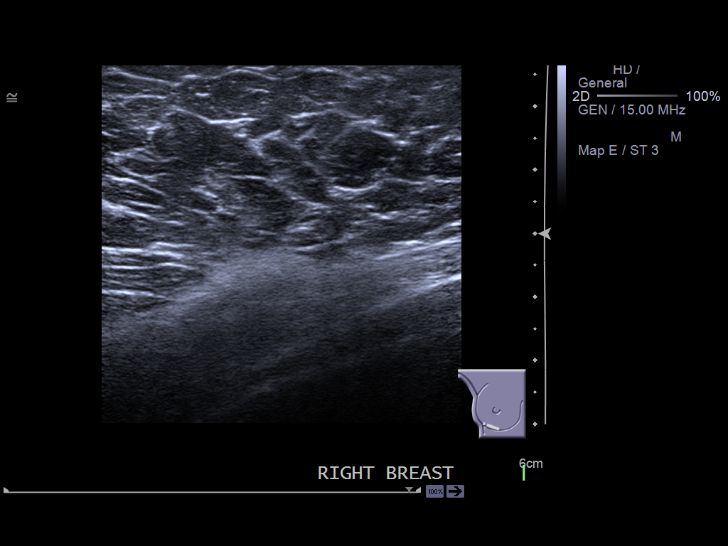
[im 16/20]
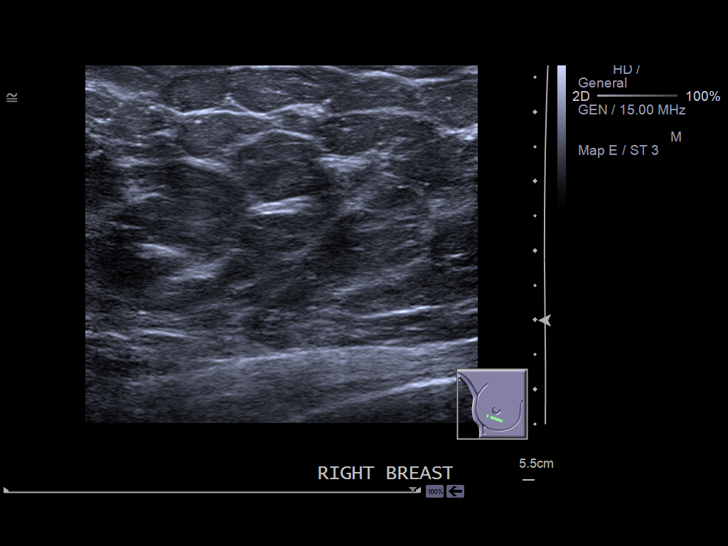
[im 17/20]
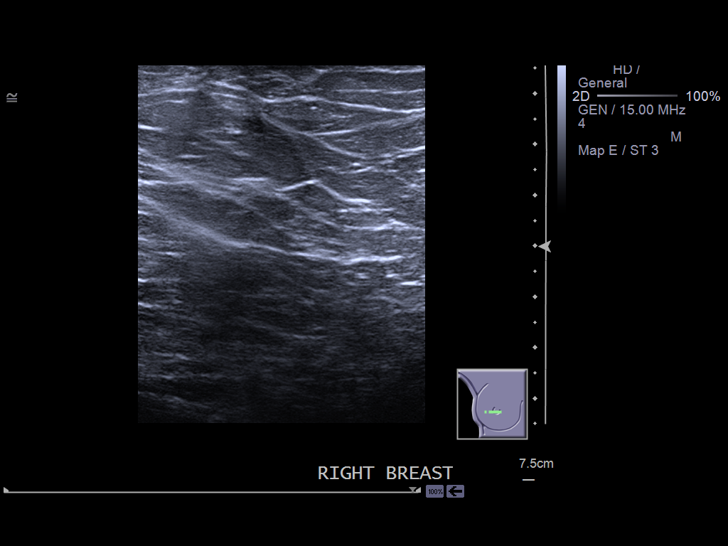
[im 18/20]
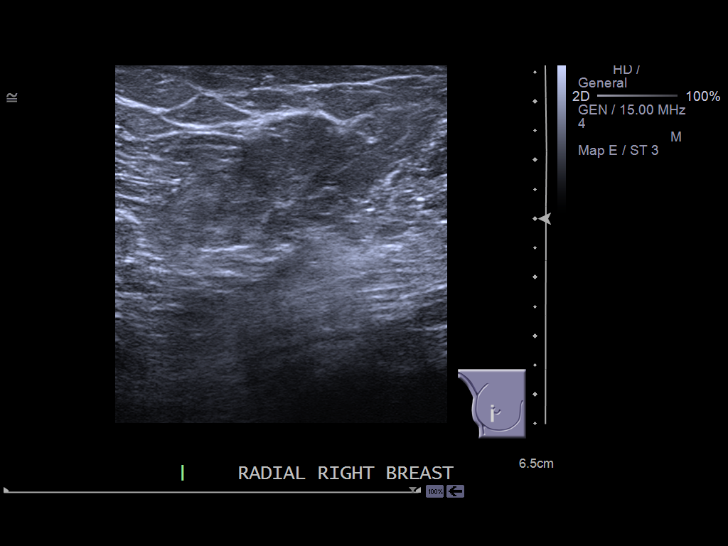
[im 20/20]
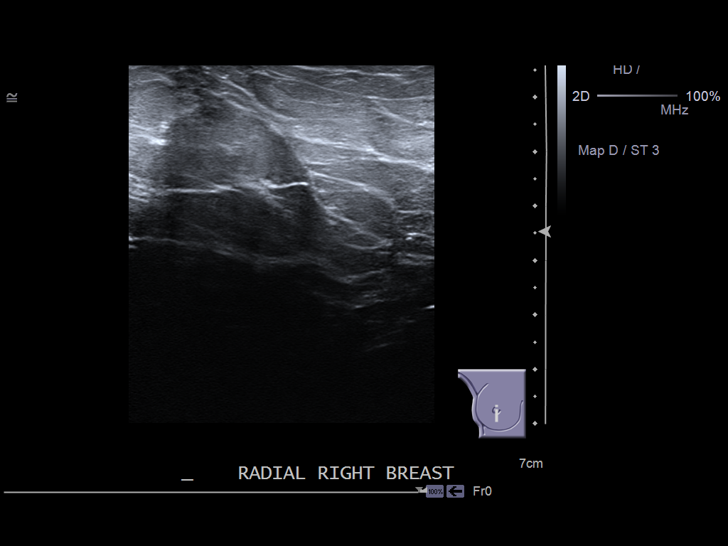

[14 of 20 positions shown; findings below may reference images not displayed]

IMPRESSION: No abnormality noted by ultrasound. Followup mammogram 3-6
months suggested.

BI-RADS: Category 3 - Probably Benign Finding - Initial Short Interval
Follow - Up Suggested

A NEGATIVE MAMMOGRAM REPORT DOES NOT PRECLUDE BIOPSY OR OTHER EVALUATION OF
A CLINICALLY PALPABLE OR OTHERWISE SUSPICIOUS MASS OR LESION. BREAST CANCER
MAY NOT BE DETECTED IN UP T0 10% OF CASES.

## 2013-02-06 IMAGING — CR DG CHEST 1V PORT
1 series · 1 of 1 positions shown · non-contrast
Comparison: none

REASON FOR EXAM: tachycardia
COMMENTS:

[ap]
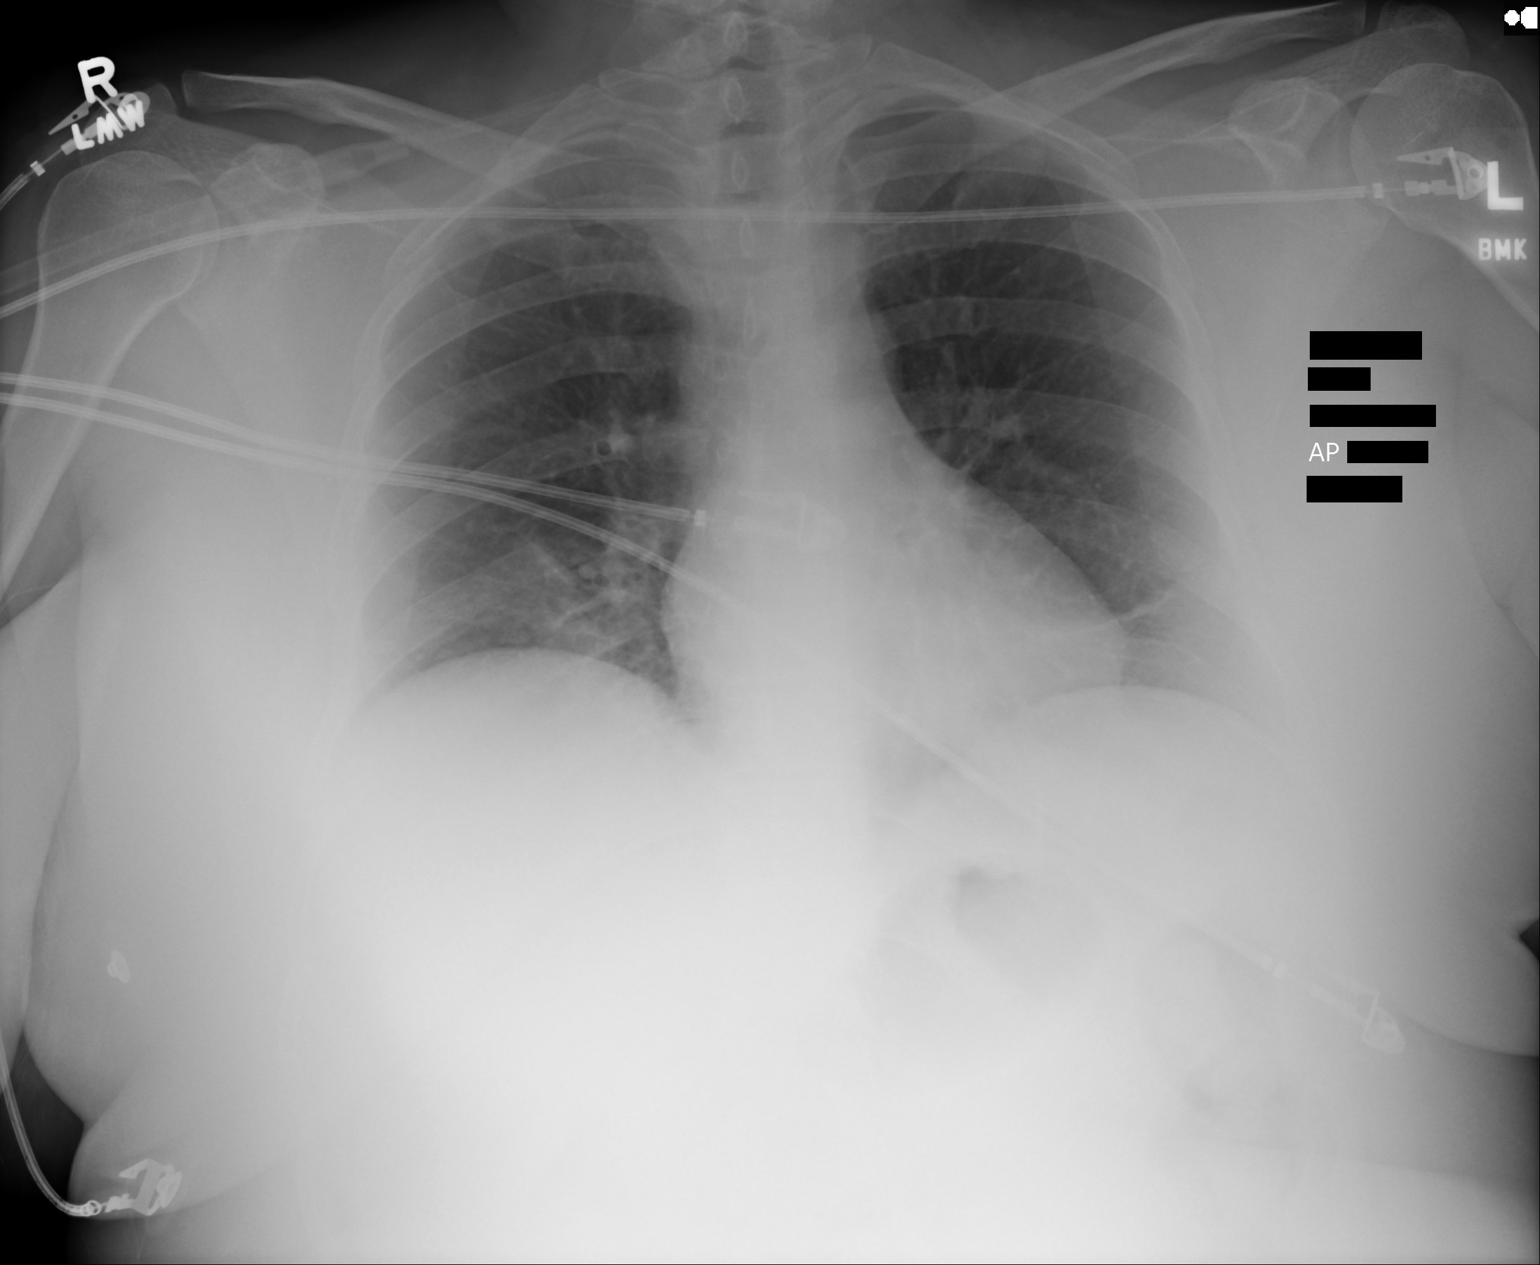

[1 of 1 positions shown; findings below may reference images not displayed]

PROCEDURE:     DXR - DXR PORTABLE CHEST SINGLE VIEW  - June 06, 2012 [DATE]

RESULT:     The lungs are clear. The heart and pulmonary vessels are normal.
The bony and mediastinal structures are unremarkable. There is no effusion.
There is no pneumothorax or evidence of congestive failure. The image shows
shallow inspiratory effort.
IMPRESSION: No acute cardiopulmonary disease.

[REDACTED]

## 2013-02-08 ENCOUNTER — Encounter: Payer: Self-pay | Admitting: Family Medicine

## 2013-02-08 ENCOUNTER — Other Ambulatory Visit: Payer: Self-pay | Admitting: Family Medicine

## 2013-02-08 ENCOUNTER — Ambulatory Visit (INDEPENDENT_AMBULATORY_CARE_PROVIDER_SITE_OTHER): Payer: 59 | Admitting: Family Medicine

## 2013-02-08 VITALS — BP 120/80 | HR 125 | Temp 103.0°F | Wt 231.0 lb

## 2013-02-08 DIAGNOSIS — E871 Hypo-osmolality and hyponatremia: Secondary | ICD-10-CM

## 2013-02-08 DIAGNOSIS — R509 Fever, unspecified: Secondary | ICD-10-CM | POA: Insufficient documentation

## 2013-02-08 LAB — CBC WITH DIFFERENTIAL/PLATELET
Basophils Relative: 0.4 % (ref 0.0–3.0)
Eosinophils Relative: 0.1 % (ref 0.0–5.0)
HCT: 42.7 % (ref 36.0–46.0)
Lymphs Abs: 1 10*3/uL (ref 0.7–4.0)
MCV: 88.2 fl (ref 78.0–100.0)
Monocytes Absolute: 0.7 10*3/uL (ref 0.1–1.0)
RBC: 4.84 Mil/uL (ref 3.87–5.11)
WBC: 8.4 10*3/uL (ref 4.5–10.5)

## 2013-02-08 LAB — POCT URINALYSIS DIPSTICK
Bilirubin, UA: NEGATIVE
Glucose, UA: NEGATIVE
Ketones, UA: NEGATIVE
Spec Grav, UA: 1.02

## 2013-02-08 LAB — COMPREHENSIVE METABOLIC PANEL
Alkaline Phosphatase: 118 U/L — ABNORMAL HIGH (ref 39–117)
BUN: 8 mg/dL (ref 6–23)
Glucose, Bld: 101 mg/dL — ABNORMAL HIGH (ref 70–99)
Sodium: 130 mEq/L — ABNORMAL LOW (ref 135–145)
Total Bilirubin: 0.2 mg/dL — ABNORMAL LOW (ref 0.3–1.2)
Total Protein: 8.4 g/dL — ABNORMAL HIGH (ref 6.0–8.3)

## 2013-02-08 NOTE — Addendum Note (Signed)
Addended by: Baldomero Lamy on: 02/08/2013 03:10 PM   Modules accepted: Orders

## 2013-02-08 NOTE — Patient Instructions (Addendum)
Strep test was negative.  Flu test was negative. This could be all viral - if so, fever should resolve by Saturday. Blood work today and urine checked today. If any worsening, please seek urgent care or ER over weekend. Alternate tylenol and ibuprofen every 4 hours.

## 2013-02-08 NOTE — Addendum Note (Signed)
Addended by: Josph Macho A on: 02/08/2013 05:53 PM   Modules accepted: Orders

## 2013-02-08 NOTE — Addendum Note (Signed)
Addended by: Eustaquio Boyden on: 02/08/2013 11:39 PM   Modules accepted: Orders

## 2013-02-08 NOTE — Addendum Note (Signed)
Addended by: Eustaquio Boyden on: 02/08/2013 06:05 PM   Modules accepted: Orders

## 2013-02-08 NOTE — Progress Notes (Signed)
  Subjective:    Patient ID: Krystal Campos, female    DOB: Aug 27, 1975, 37 y.o.   MRN: [id number]  HPI CC: fever, not feeling well  4d h/o feeling ill.  Tmax 102 at home.  Significant body aches, pain throughout body.  Arthralgias - knees, ankles, hands, back, neck. + R lower tooth pain. + HA, neck pain which started on Tuesday as well.  Treating fever and aches with 800mg  ibuprofen.  No tylenol.  Denies recent bug bites, tick bites, new rashes. No coughing, congestion, other viral sxs like sore throat, sneezing or nasal congestion. No ear pain.  No abd pain, nausea/vomiting, diarrhea.  Some constipation - last BM was Tuesday. No chest pain, dyspnea.  Husband feeling ill as well - recently with nausea/diarrhea.  Husband with low grade fever as well.  No new medicines No recent traveling other than [place] a few weeks ago Naprosyn causes worsening GERD.  Last ibuprofen 800mg  was at 6am today.  Past Medical History  Diagnosis Date  . Carpal tunnel syndrome   . Personal history of malignant neoplasm of cervix uteri   . Chronic migraine without aura, with intractable migraine, so stated, without mention of status migrainosus     Past Surgical History  Procedure Laterality Date  . Cervical biopsy  10/06    cold kinfe bx  . Leep  9/06    positive  . Wedge resection  10/08/08    Left ovarian endometrioma--[place]  . Total abdominal hysterectomy w/ bilateral salpingoophorectomy  06/2012    done for dysmenorrhagia and endometriosis - ?supracervical     Review of Systems Per HPI    Objective:   Physical Exam  Nursing note and vitals reviewed. Constitutional: She appears well-developed and well-nourished. No distress.  HENT:  Head: Normocephalic and atraumatic.  Right Ear: Hearing, tympanic membrane, external ear and ear canal normal.  Left Ear: Hearing, tympanic membrane, external ear and ear canal normal.  Nose: No mucosal edema or rhinorrhea.  Mouth/Throat: Uvula is  midline and oropharynx is clear and moist. Mucous membranes are dry. No oropharyngeal exudate, posterior oropharyngeal edema, posterior oropharyngeal erythema or tonsillar abscesses.  Eyes: Conjunctivae and EOM are normal. Pupils are equal, round, and reactive to light. No scleral icterus.  Neck: Normal range of motion. Neck supple.  Cardiovascular: Normal rate, regular rhythm, normal heart sounds and intact distal pulses.   No murmur heard. Pulmonary/Chest: Effort normal and breath sounds normal. No respiratory distress. She has no wheezes. She has no rales.  Abdominal: Soft. Normal appearance and bowel sounds are normal. She exhibits no distension and no mass. There is tenderness in the left upper quadrant. There is no rigidity, no rebound, no guarding, no CVA tenderness and negative Murphy's sign.  Obese  Musculoskeletal: She exhibits no edema.  Neg kerdnig and bruzynski signs FROM at neck without tenderness/pain  Lymphadenopathy:    She has cervical adenopathy (bilateral AC LAD).  Skin: She is diaphoretic.       Assessment & Plan:  Given 1000mg  tylenol at 11:51am

## 2013-02-08 NOTE — Assessment & Plan Note (Addendum)
Fever with diffuse arthralgias.  I think this is a viral process. Flu swab negative today. RST negative today - culture sent regardless given 3/4 centor criteria. Doubt viral meningitis as no stiffness on exam today. Possibly vector borne viral illness but pt denies any recent bug or tick bites. Blood work today to further evaluate this fever - CMP, CBC, urinalysis. To update me if new sxs develop, to ER if worsening in interim.  Pt agrees.  UA - microhematuria present - will continue to monitor.  Sent urine cultured despite no urinary sxs.

## 2013-02-08 NOTE — Addendum Note (Signed)
Addended by: Patience Musca on: 02/08/2013 01:19 PM   Modules accepted: Orders

## 2013-02-09 ENCOUNTER — Other Ambulatory Visit: Payer: 59

## 2013-02-11 LAB — URINE CULTURE: Colony Count: 25000

## 2013-02-15 ENCOUNTER — Encounter: Payer: Self-pay | Admitting: Family Medicine

## 2013-02-15 ENCOUNTER — Ambulatory Visit (INDEPENDENT_AMBULATORY_CARE_PROVIDER_SITE_OTHER): Payer: 59 | Admitting: Family Medicine

## 2013-02-15 VITALS — BP 128/90 | HR 88 | Temp 98.1°F | Wt 235.0 lb

## 2013-02-15 DIAGNOSIS — E871 Hypo-osmolality and hyponatremia: Secondary | ICD-10-CM

## 2013-02-15 DIAGNOSIS — R21 Rash and other nonspecific skin eruption: Secondary | ICD-10-CM | POA: Insufficient documentation

## 2013-02-15 DIAGNOSIS — R509 Fever, unspecified: Secondary | ICD-10-CM

## 2013-02-15 LAB — COMPREHENSIVE METABOLIC PANEL
ALT: 58 U/L — ABNORMAL HIGH (ref 0–35)
Alkaline Phosphatase: 159 U/L — ABNORMAL HIGH (ref 39–117)
Sodium: 139 mEq/L (ref 135–145)
Total Bilirubin: 0.1 mg/dL — ABNORMAL LOW (ref 0.3–1.2)
Total Protein: 8.1 g/dL (ref 6.0–8.3)

## 2013-02-15 LAB — POCT URINALYSIS DIPSTICK
Glucose, UA: NEGATIVE
Ketones, UA: NEGATIVE
Leukocytes, UA: NEGATIVE
Protein, UA: NEGATIVE
Urobilinogen, UA: 0.2
pH, UA: 6.5

## 2013-02-15 NOTE — Assessment & Plan Note (Signed)
Fever has resolved. Anticipate hematuria due to fever - recheck today. Recheck hyponatremia today.  check lyme disease titers given new rash associated with prior fever.

## 2013-02-15 NOTE — Progress Notes (Signed)
  Subjective:    Patient ID: Krystal Campos, female    DOB: 1975-11-08, 37 y.o.   MRN: [id number]  HPI CC: f/u fever  Seen here 02/08/2013 with unexplained fever.  Work up unrevealing (flu swab, strep test, CBC).  Noted hyponatremia. Still with neck soreness. No confusion, headache, or other new symptoms. No eye symptoms.  Saw OBGYN for sore in private area - told possible herpetic lesion, but culture was negative.  Has started noticing skin rash over last week - left back, R forearm, L ear, L upper arm.  Pt wonders about Behcet's disease.  Past Medical History  Diagnosis Date  . Carpal tunnel syndrome   . Personal history of malignant neoplasm of cervix uteri   . Chronic migraine without aura, with intractable migraine, so stated, without mention of status migrainosus   . Endometriosis     Review of Systems Per HPI    Objective:   Physical Exam  Nursing note and vitals reviewed. Constitutional: She appears well-developed and well-nourished. No distress.  HENT:  Head: Normocephalic and atraumatic.  Mouth/Throat: Oropharynx is clear and moist. No oropharyngeal exudate.  Eyes: Conjunctivae and EOM are normal. Pupils are equal, round, and reactive to light. No scleral icterus.  Neck: Normal range of motion. Neck supple.  Cardiovascular: Normal rate, regular rhythm, normal heart sounds and intact distal pulses.   No murmur heard. Pulmonary/Chest: Effort normal and breath sounds normal. No respiratory distress. She has no wheezes. She has no rales.  Abdominal: Soft. Bowel sounds are normal. She exhibits no distension and no mass. There is no tenderness. There is no rebound and no guarding.  Musculoskeletal: She exhibits no edema.  Skin: Skin is warm and dry. Rash noted. There is erythema.  Erythematous macular rash with peripheral clearing - largest spot on left lower back, some smaller lesions on bilateral arms and R knee Newest spot somewhat nodular       Assessment & Plan:

## 2013-02-15 NOTE — Addendum Note (Signed)
Addended by: Josph Macho A on: 02/15/2013 02:41 PM   Modules accepted: Orders

## 2013-02-15 NOTE — Patient Instructions (Addendum)
Blood work and urine checked today. I'm glad you're feeling better.  This may be Behcet's.   We will continue to watch for progression.  May refer you to rheumatologist if new symptoms develop or not improving as expected.  Behet's Disease Behet's disease is a rare, chronic, lifelong disorder. It involves inflammation of blood vessels throughout the body. CAUSES  The exact cause is unknown. It is believed that an autoimmune reaction may cause blood vessels to become inflamed. It is not clear what triggers this reaction. SYMPTOMS  Symptoms of Behet's disease include:  Returning (recurrent) genital ulcers and oral ulcers that resemble canker sores.  Eye inflammation. The disorder may also cause:  Various types of skin sores (lesions).  Arthritis.  Bowel inflammation.  Inflammation of the membranes of the brain and spinal cord (meningitis). This disease generally begins when patients are in their 86s or 30s. But all age groups may be affected. Behet's is a multisystem disease. It may involve all organs and affect the central nervous system. This may cause:  Memory loss.  Impaired speech, balance, and movement. TREATMENT  There is no cure for Behet's disease. Treatment typically focuses on reducing discomfort and preventing serious complications. The effects of the disease may include blindness, stroke, swelling of the spinal cord, or intestinal complications. Corticosteroids and other medications that suppress the immune system may be given to treat inflammation. Behet's recurs or keeps causing problems (chronic). But patients may have periods of time when symptoms go away temporarily (remission). How bad the disease gets is different from patient to patient. Some patients may live normal lives. Others may become blind or severely disabled. FOR MORE INFORMATION American Behet's Disease Association: www.behcets.com Document Released: 07/09/2002 Document Revised: 10/11/2011  Document Reviewed: 07/19/2005 [facility] Patient Information 2014 [place], [place].

## 2013-02-15 NOTE — Assessment & Plan Note (Signed)
?  erythema nodosum.  Check B burgdorferi Ab. Possible behcet's? Consider referral to rheum if recurrent genital sore or if any progressive sxs.

## 2013-02-16 LAB — B. BURGDORFI ANTIBODIES: B burgdorferi Ab IgG+IgM: 2.66 {ISR} — ABNORMAL HIGH

## 2013-02-20 ENCOUNTER — Other Ambulatory Visit: Payer: Self-pay | Admitting: Family Medicine

## 2013-02-20 MED ORDER — DOXYCYCLINE HYCLATE 100 MG PO CAPS: 100.0000 mg | ORAL_CAPSULE | Freq: Two times a day (BID) | ORAL | Status: AC

## 2014-11-19 NOTE — Consult Note (Signed)
PCP Dr Windy CarinaSchallerdr jackson/dr weaverLee Acute post-hemorrahgic anemia secondary to blood loss about 2000 cc per staffrecieved 6L of crystalloid fluid in the OR and PACU with 2 unit PRBC  -hgb pre-op was 13.8--->post op 8.8 and tthen recieved 2 units PRBC due to #1  dysmenorrahgia s/p open hysterectomy with bilateral ovariectomy and lysis of adhesions uop s/p volume losscheck foley drainiagebmp proph with scd and teds     Electronic Signatures: Willow OraPatel, Emalee Knies A (MD) (Signed on 05-Nov-13 21:16)  Authored   Last Updated: 05-Nov-13 21:22 by Willow OraPatel, Arnold Depinto A (MD)

## 2014-11-19 NOTE — Op Note (Signed)
PATIENT NAME:  Krystal Campos, [name]#:  [id number] DATE OF BIRTH:  1975-12-14  DATE OF PROCEDURE:  06/06/2012  PREOPERATIVE DIAGNOSES: Ovarian cysts bilaterally and fibroid uterus.   POSTOPERATIVE DIAGNOSES: Fibroid uterus, endometriosis, bilateral endometriomas, and severe pelvic adhesions.   PROCEDURES: Abdominal supracervical hysterectomy, bilateral ovarian cystectomies, and extensive lysis of adhesions.   SURGEON: Senaida LangeLashawn Weaver Lee, MD   ASSISTANT: Thomasene MohairStephen Jackson, MD.   ESTIMATED BLOOD LOSS: 2 liters.  OPERATIVE FLUIDS: 2700 mL.   COMPLICATIONS: None.   FINDINGS: Enlarged bilateral ovaries containing endometriomas filled with thick, chocolate fluid. Both ovaries were adhesed to one another and adhesed to the posterior uterus and uterine fundus. Enlarged fibroid uterus. Adhesions of posterior uterus to cul-de-sac. Adhesions of bladder to anterior uterus.   SPECIMEN: Uterus without cervix, left and right ovarian cyst walls.   CONDITION: Stable.   INDICATIONS: The patient is a 39 year old who presents with pelvic pain diagnosed with uterine fibroids and bilateral ovarian cysts by ultrasound. The patient desired definitive surgical management. Risks, benefits, indications, and alternatives of the procedure were explained and informed consent was obtained.   DESCRIPTION OF PROCEDURE: The patient was taken to the Operating Room with IV fluids running. She was prepped and draped in a sterile fashion in [place] stirrups. A speculum was placed inside of the vagina. The anterior lip of the cervix was grasped with a single-tooth tenaculum. Attempt at sounding the uterus with the uterine sound was not successful, therefore, lacrimal probes were used to dilate the cervix. Further cervical dilatation was done with Hank dilators. A V-Care uterine manipulator was placed. Attention was turned to the patient's abdomen where the infraumbilical region was injected with 0.5% Sensorcaine. A 5 mm incision was  made. The Veress needle was placed and the abdomen was insufflated with CO2 gas. The Veress needle was removed and the abdomen was entered under direct visualization using a 5 mm X-Cel trocar. At this point, findings were as previously stated. With identification of all the pelvic adhesions and immobility in the pelvis, it was decided to proceed towards an open procedure. At this point, all instrumentation was removed from the patient's vagina including the V-care manipulator. Attention was turned to the patient's abdomen where a midline skin incision was made. The incision was carried down to the underlying fascia using Bovie cautery on cut setting. The fascia was cut in the midline with the Bovie and the incision was extended using sharp dissection with the knife as well as Bovie cautery. Two Kelly's were used to grasp the peritoneum which was entered sharply with Metzenbaum. This opening was then extended using the Metzenbaum scissors. Findings were as previously stated. The Balfour retractor was placed inside of the abdomen. The bowels were packed back with moist laparotomy sponges. The Balfour upper extender was placed for further retraction upward and a bladder blade was placed. The left ovary was identified and an incision made over the ovarian cyst using the knife. DeBakeys and Metzenbaum scissors were used to carefully dissect ovarian cyst from the ovarian cortex. This was done with much difficulty secondary to the size of the cyst as well as the amount of adhesions. A cystotomy was then made in the ovarian cyst and copious amounts of thick, chocolate fluid drained from the cyst. A combination of sharp and blunt dissection was used to ultimately remove the cyst from the ovarian cortex. This was likewise repeated on the patient's right ovary and the right ovarian cyst wall was removed and both were sent to pathology.  At this point, given the amount of pelvic adhesions, urology was called to place stents for  identification of the ureters during the remainder of the procedure. The right stent was able to be placed in the right ureter, however, a guidewire was placed in the left ureter. However, the stent itself was not able to be successfully advanced. At this point, urology left the guidewire in the left ureter and stent was successfully placed in the right ureter. For further information on the rest of this procedure, please see Dr. Amada Jupiter dictated operative report. At this point, the uterus was grasped with a double-tooth tenaculum and the round ligament was grasped with Kocher clamps x2, cut, and suture ligated on the patient's right side. The anterior leaf of the broad ligament was then excised using Bovie cautery and the bladder flap was created all the way anterior to the uterus. On the patient's left side, the round ligament was likewise doubly suture ligated, cut, and cauterized. The anterior leaf of the broad ligament was excised using Bovie cautery and the remainder of the bladder flap was taken down. The utero-ovarian ligament was difficult to identify secondary to inflammatory changes from the endometriosis and enlargement of the ovaries. The infundibulopelvic ligament was clamped using Heaney clamps in successive bites, cut, and suture ligated. The remainder of the broad ligament was then clamped, cut, and suture ligated, on the right side, down to the level of the internal cervical os. This was repeated on the patient's left side. The uterus was then amputated at the level of the cervix, at this point. The cervical os was cauterized using Bovie. The cervical stump was closed using #0 Vicryl suture. All surgical areas were identified, evaluated, and for the most part found to be hemostatic. However, there was slow steady oozing from deep in the pelvis. Given the location of the colon in this area, it was decided to place Surgiflo for hemostasis. The pelvis prior to that was irrigated with copious amounts  of warm normal saline and dried using laparotomy sponges. All laps, sponges, and instrumentation were removed from the patient's abdomen. The fascia was closed with #0 double-stranded PDS and the subcutaneous tissue was closed with a #1 plain gut on a large needle; the skin was closed with staples. At the end of the case, the Foley catheters,  which were connected to the guidewire on the left and the stent on the right, were all removed from the patient's bladder. The patient had been previously given indigo carmine dye and cystoscopy was performed. Dye was seen to spill freely from the left ureteral orifice, however, there was no dye spillage from the right ureteral orifice as there was a blood clot blocking the opening. At this point, a guidewire was placed through a port on the cystoscope and this clot was removed. At this point, indigo carmine dye was seen to spill from the right ureteral orifice indicating bilateral ureteral patency. The procedure was ended at this point. Sponge, needle and instrument counts were correct x2. The patient was awakened from anesthesia and taken to the recovery room in stable condition.  ____________________________ Sonda Primes Patton Salles, MD law:slb D: 06/06/2012 17:17:04 ET T: 06/07/2012 08:32:25 ET JOB#: [id number]  cc: Flint Melter A. Patton Salles, MD, <Dictator> Janyth Contes LEE MD ELECTRONICALLY SIGNED 06/15/2012 11:36

## 2014-11-19 NOTE — Consult Note (Signed)
PATIENT NAME:  Krystal Campos, Krystal Campos MR#:  [id number] DATE OF BIRTH:  1975-10-04  DATE OF CONSULTATION:  06/06/2012  REFERRING PHYSICIAN:  Senaida LangeLashawn Weaver-Lee, MD CONSULTING PHYSICIAN:  Krystal ConnersMichael R. Evelene CroonWolff, MD  REASON FOR CONSULTATION: Needs stents.   HISTORY OF PRESENT ILLNESS: [name] was undergoing hysterectomy and due to significant endometriosis and difficulty with pelvic dissection stents were requested.   IMPRESSION: Endometriosis.   PLAN: Proceed with stents.   ____________________________ Krystal ConnersMichael R. Evelene CroonWolff, MD mrw:drc D: 06/06/2012 12:59:05 ET T: 06/06/2012 13:03:51 ET JOB#: [id number]  cc: Krystal ConnersMichael R. Evelene CroonWolff, MD, <Dictator> Krystal ApeMICHAEL R WOLFF MD ELECTRONICALLY SIGNED 06/06/2012 15:29

## 2014-11-19 NOTE — Op Note (Signed)
PATIENT NAME:  Krystal Campos, [name] MR#:  [id number] DATE OF BIRTH:  19-Sep-1975  DATE OF PROCEDURE:  06/06/2012  PREOPERATIVE DIAGNOSIS: Endometriosis.   POSTOPERATIVE DIAGNOSIS: Endometriosis.   PROCEDURE: Cystoscopy with right stent placement and left guidewire placement.   SURGEON: Suszanne ConnersMichael R. Evelene CroonWolff, MD  ANESTHETIST: Zena AmosWilliam Kephart, M.D.   ANESTHESIA: General.   INDICATIONS: See the dictated consultation.   OPERATIVE SUMMARY: The 8021 French cystoscope sheath was advanced into the bladder with the obturator in place. The cystoscope was then coupled with the camera then placed into the sheath. Bladder was thoroughly inspected. Both ureteral orifices were identified and had clear efflux. No bladder mucosal lesions were identified. At this point a 0.035 Glidewire was advanced up the right ureter to the level of the kidney. A 6 French open-ended ureteral catheter was advanced over the guidewire. The guidewire was removed taking care to leave the stent in position. The cystoscope was removed and then replaced back into the bladder. A 0.035 Glidewire was then advanced up the left ureter to the level of the kidney. Several attempts were made to pass a 6 [place] open-ended catheter over the guidewire but there was resistance approximately a centimeter proximal to the orifice. Therefore, the guidewire was left in place. The cystoscope was then removed taking care to leave the stent and guidewire in position. A 16 French Foley catheter was placed. The stent and guidewire were then anchored to the Foley catheter. This portion of the procedure was then terminated.   ____________________________ Suszanne ConnersMichael R. Evelene CroonWolff, MD mrw:cms D: 06/06/2012 12:57:57 ET T: 06/06/2012 13:13:48 ET  JOB#: [id number] cc: Suszanne ConnersMichael R. Evelene CroonWolff, MD, <Dictator>  Orson ApeMICHAEL R WOLFF MD ELECTRONICALLY SIGNED 06/06/2012 15:29

## 2014-11-19 NOTE — Consult Note (Signed)
PATIENT NAME:  Krystal Campos, Krystal Campos MR#:  [id number] DATE OF BIRTH:  [birth year]  DATE OF CONSULTATION:  06/06/2012  REFERRING PHYSICIAN:  Dr. Thomasene MohairStephen Campos / Dr. Patton Campos, gynecology  CONSULTING PHYSICIAN:  Krystal Campos A. Allena KatzPatel, MD  PRIMARY CARE PHYSICIAN: Dr. Laurita Quintobert Campos   REASON FOR CONSULTATION: Postop tachycardia, poor urine output and anemia.   HISTORY OF PRESENT ILLNESS: [name] is a 39 year old Caucasian female with past medical history of migraine headaches, underwent elective hysterectomy for her symptoms of dysmenorrhagia. Patient initially was scheduled for laparoscopic hysterectomy, however, ended up having open hysterectomy with bilateral oophorectomy and lysis of adhesions. Patient had significant amount of blood loss, about 2000 mL during surgery. She received total of 6 liters of crystalloid and 2 PRBCs. Patient's blood pressure is 120/80 at present, however, she is tachycardic in the 120s. She denies any chest pain, shortness of breath. She feels her belly is "going to explode", she needs to PE. Her urine output is about 30 mL in the past hour and bladder scan done by the PAC-U nurse showed empty bladder. Patient was transferred to the Critical Care Unit for further monitoring. Internal medicine was consulted for postoperative management for tachycardia.   PAST MEDICAL HISTORY:  1. Migraine headaches.  2. Dysmenorrhagia.   MEDICATIONS:  1. Venlafaxine extended release 150 mg p.o. daily.  2. Flexeril 10 mg every eight hours.  3. Diclofenac 50 mg one packet orally p.r.n.   FAMILY HISTORY: Positive for hypertension.   ALLERGIES: Adhesive and tapes.   SOCIAL HISTORY: She is married. She smokes about a pack a day. She denies alcohol use.    REVIEW OF SYSTEMS: CONSTITUTIONAL: Positive for fatigue, weakness. EYES: No blurred or double vision. No glaucoma. ENT: No tinnitus, ear pain, hearing loss, or epistaxis. RESPIRATORY: No cough, wheeze, hemoptysis, or chronic obstructive  pulmonary disease. CARDIOVASCULAR: No chest pain, orthopnea, edema, or hypertension. GASTROINTESTINAL: No nausea, vomiting, diarrhea. Positive for abdominal pain. GENITOURINARY: No dysuria, hematuria. ENDOCRINE: No polyuria, nocturia. HEMATOLOGY: No anemia or easy bruising. SKIN: No acne, rash. MUSCULOSKELETAL: No arthritis. NEUROLOGIC: No cerebrovascular accident, transient ischemic attack. PSYCH: No anxiety or depression. All other systems reviewed and negative.   PHYSICAL EXAMINATION:  VITAL SIGNS: Patient is seen in the Critical Care Unit. She is afebrile, pulse 121 regular, blood pressure 120/84, sats 98% on 2 liters.   GENERAL: She looks well nourished, well dressed, generalized pallor present.   HEENT: Atraumatic, normocephalic. Pupils are equal, round, and reactive to light and accommodation. Extraocular movements intact. Oral mucosa is dry.   NECK: Supple. No JVD. No carotid bruit.   RESPIRATORY: Clear to auscultation bilaterally. Decreased breath sounds in the bases. No murmur heard. PMI not lateralized. Chest nontender.   EXTREMITIES: Good pedal pulses, good femoral pulses. No lower extremity edema.   ABDOMEN: Distended. She has extensive surgical dressing present on the abdomen.   NEUROLOGIC: Patient is just postop. She is able to move, wiggle her toes and fingers very well. There are no focal neuro deficits seen.   SKIN: Warm and dry.   EXTREMITIES: Good pedal pulses, good femoral pulses. No lower extremity edema.   LABORATORY, DIAGNOSTIC AND RADIOLOGICAL DATA: Blood glucose 147, hemoglobin 8.8. Metabolic panel is pending.   Telemetry monitoring shows sinus tachycardia.   ASSESSMENT AND PLAN: 39 year old [name] with history of dysmenorrhagia secondary to endometriosis is being admitted due to:  1. Acute posthemorrhagic anemia secondary to significant volume loss/blood loss about 2000 mL during surgery. Patient underwent abdominal supracervical hysterectomy with  bilateral  ovarian cystectomies with extensive lysis of adhesions. Patient also underwent cystoscopy with right-sided stent placement and left wire placement which were removed and done by Dr. Artis Campos. The patient is currently postoperative. She received 6 liters of crystalloid fluid in the Operating Room and PAC-U along with 2 units of PRBCs. Preop hemoglobin was around 13, postoperative was 8.8 then 2 packed RBCs. Her current CBC is still pending. After current bag of lactated Ringer is over will switch to dextrose with normal saline at 100/h. Will monitor ins and outs. Her poor urine output is likely due to volume loss which likely will pick up within the next 24 hours.  2. Tachycardia due to #1.  3. Poor urine output status post volume loss. Will check Foley drainage. Check metabolic panel at this time. Monitor ins and outs closely, continue IV fluids.  4. Patient is currently on morphine PCA pump which is ordered by Dr. Patton Salles. Will also add Protonix IV daily for now. 5. Deep vein thrombosis prophylaxis with SCDs and TEDs.   Thank you for the consult. Further work-up according to patient's clinical course. This was discussed with the patient.   TIME SPENT: 55 minutes.   ____________________________ Krystal Campos Allena Katz, MD sap:cms D: 06/06/2012 21:31:23 ET T: 06/07/2012 09:12:39 ET JOB#: [id number]  cc: Krystal Vivar A. Allena Katz, MD, <Dictator> Krystal Silence, MD Krystal Ora MD ELECTRONICALLY SIGNED 06/13/2012 1:24

## 2022-05-12 ENCOUNTER — Other Ambulatory Visit (HOSPITAL_COMMUNITY): Payer: Self-pay
